# Patient Record
Sex: Female | Born: 1945
Health system: Southern US, Community
[De-identification: ages and names within clinical notes are randomized; demographics above are authoritative.]

## PROBLEM LIST (undated history)

## (undated) DIAGNOSIS — R0981 Nasal congestion: Secondary | ICD-10-CM

## (undated) DIAGNOSIS — Z973 Presence of spectacles and contact lenses: Secondary | ICD-10-CM

## (undated) DIAGNOSIS — Z8489 Family history of other specified conditions: Secondary | ICD-10-CM

## (undated) DIAGNOSIS — R7303 Prediabetes: Secondary | ICD-10-CM

## (undated) DIAGNOSIS — M1990 Unspecified osteoarthritis, unspecified site: Secondary | ICD-10-CM

## (undated) DIAGNOSIS — K219 Gastro-esophageal reflux disease without esophagitis: Secondary | ICD-10-CM

## (undated) DIAGNOSIS — N840 Polyp of corpus uteri: Secondary | ICD-10-CM

## (undated) DIAGNOSIS — J309 Allergic rhinitis, unspecified: Secondary | ICD-10-CM

## (undated) DIAGNOSIS — C801 Malignant (primary) neoplasm, unspecified: Secondary | ICD-10-CM

## (undated) DIAGNOSIS — F32A Depression, unspecified: Secondary | ICD-10-CM

## (undated) HISTORY — PX: COMBINED HYSTEROSCOPY DIAGNOSTIC / D&C: SUR297

## (undated) HISTORY — PX: MOHS SURGERY: SUR867

## (undated) HISTORY — DX: Polyp of corpus uteri: N84.0

## (undated) HISTORY — PX: CHOLECYSTECTOMY: SHX55

## (undated) HISTORY — PX: TUBAL LIGATION: SHX77

## (undated) HISTORY — DX: Unspecified osteoarthritis, unspecified site: M19.90

---

## 1965-03-26 HISTORY — PX: APPENDECTOMY: SHX54

## 1998-03-26 HISTORY — PX: FOOT SURGERY: SHX648

## 1998-05-18 ENCOUNTER — Other Ambulatory Visit: Admission: RE | Admit: 1998-05-18 | Discharge: 1998-05-18 | Payer: Self-pay | Admitting: Obstetrics and Gynecology

## 1998-05-19 ENCOUNTER — Other Ambulatory Visit: Admission: RE | Admit: 1998-05-19 | Discharge: 1998-05-19 | Payer: Self-pay | Admitting: Obstetrics and Gynecology

## 1999-05-31 ENCOUNTER — Other Ambulatory Visit: Admission: RE | Admit: 1999-05-31 | Discharge: 1999-05-31 | Payer: Self-pay | Admitting: Obstetrics and Gynecology

## 1999-11-16 ENCOUNTER — Encounter (INDEPENDENT_AMBULATORY_CARE_PROVIDER_SITE_OTHER): Payer: Self-pay

## 1999-11-16 ENCOUNTER — Other Ambulatory Visit: Admission: RE | Admit: 1999-11-16 | Discharge: 1999-11-16 | Payer: Self-pay | Admitting: Obstetrics and Gynecology

## 2000-06-06 ENCOUNTER — Other Ambulatory Visit: Admission: RE | Admit: 2000-06-06 | Discharge: 2000-06-06 | Payer: Self-pay | Admitting: Obstetrics and Gynecology

## 2001-06-09 ENCOUNTER — Other Ambulatory Visit: Admission: RE | Admit: 2001-06-09 | Discharge: 2001-06-09 | Payer: Self-pay | Admitting: Obstetrics and Gynecology

## 2002-07-09 ENCOUNTER — Other Ambulatory Visit: Admission: RE | Admit: 2002-07-09 | Discharge: 2002-07-09 | Payer: Self-pay | Admitting: Obstetrics and Gynecology

## 2003-09-14 ENCOUNTER — Other Ambulatory Visit: Admission: RE | Admit: 2003-09-14 | Discharge: 2003-09-14 | Payer: Self-pay | Admitting: Obstetrics and Gynecology

## 2004-08-24 ENCOUNTER — Ambulatory Visit (HOSPITAL_COMMUNITY): Admission: RE | Admit: 2004-08-24 | Discharge: 2004-08-24 | Payer: Self-pay | Admitting: Obstetrics and Gynecology

## 2004-08-24 ENCOUNTER — Ambulatory Visit (HOSPITAL_BASED_OUTPATIENT_CLINIC_OR_DEPARTMENT_OTHER): Admission: RE | Admit: 2004-08-24 | Discharge: 2004-08-24 | Payer: Self-pay | Admitting: Obstetrics and Gynecology

## 2004-08-24 ENCOUNTER — Encounter (INDEPENDENT_AMBULATORY_CARE_PROVIDER_SITE_OTHER): Payer: Self-pay | Admitting: *Deleted

## 2004-09-15 ENCOUNTER — Other Ambulatory Visit: Admission: RE | Admit: 2004-09-15 | Discharge: 2004-09-15 | Payer: Self-pay | Admitting: Obstetrics and Gynecology

## 2005-09-19 ENCOUNTER — Other Ambulatory Visit: Admission: RE | Admit: 2005-09-19 | Discharge: 2005-09-19 | Payer: Self-pay | Admitting: Obstetrics and Gynecology

## 2006-10-14 ENCOUNTER — Other Ambulatory Visit: Admission: RE | Admit: 2006-10-14 | Discharge: 2006-10-14 | Payer: Self-pay | Admitting: Obstetrics and Gynecology

## 2007-10-20 ENCOUNTER — Other Ambulatory Visit: Admission: RE | Admit: 2007-10-20 | Discharge: 2007-10-20 | Payer: Self-pay | Admitting: Obstetrics and Gynecology

## 2008-03-26 HISTORY — PX: ROTATOR CUFF REPAIR: SHX139

## 2008-11-01 ENCOUNTER — Other Ambulatory Visit: Admission: RE | Admit: 2008-11-01 | Discharge: 2008-11-01 | Payer: Self-pay | Admitting: Obstetrics and Gynecology

## 2008-11-01 ENCOUNTER — Encounter: Payer: Self-pay | Admitting: Obstetrics and Gynecology

## 2008-11-01 ENCOUNTER — Ambulatory Visit: Payer: Self-pay | Admitting: Obstetrics and Gynecology

## 2008-11-11 ENCOUNTER — Ambulatory Visit: Payer: Self-pay | Admitting: Obstetrics and Gynecology

## 2009-02-09 ENCOUNTER — Ambulatory Visit: Payer: Self-pay | Admitting: Obstetrics and Gynecology

## 2009-06-29 ENCOUNTER — Ambulatory Visit: Payer: Self-pay | Admitting: Obstetrics and Gynecology

## 2009-07-02 ENCOUNTER — Ambulatory Visit: Payer: Self-pay | Admitting: Obstetrics and Gynecology

## 2009-12-21 ENCOUNTER — Other Ambulatory Visit: Admission: RE | Admit: 2009-12-21 | Discharge: 2009-12-21 | Payer: Self-pay | Admitting: Obstetrics and Gynecology

## 2009-12-21 ENCOUNTER — Ambulatory Visit: Payer: Self-pay | Admitting: Obstetrics and Gynecology

## 2010-01-17 ENCOUNTER — Ambulatory Visit: Payer: Self-pay | Admitting: Sports Medicine

## 2010-01-17 DIAGNOSIS — M25559 Pain in unspecified hip: Secondary | ICD-10-CM

## 2010-01-17 DIAGNOSIS — M5430 Sciatica, unspecified side: Secondary | ICD-10-CM

## 2010-02-07 ENCOUNTER — Ambulatory Visit: Payer: Self-pay | Admitting: Obstetrics and Gynecology

## 2010-04-27 NOTE — Assessment & Plan Note (Signed)
Summary: NP WITH R HIP/BUTTOCK PAIN X ONE MO   Vital Signs:  Patient profile:   65 year old female Height:      63 inches Weight:      128 pounds BMI:     22.76 BP sitting:   121 / 77  Vitals Entered By: Lillia Pauls CMA (January 17, 2010 10:24 AM)  History of Present Illness: 65 yo female here for pain both at the R hip and buttock.  R hip pain:  has a dx of troch bursitis, has had this injected several years ago which helped for a month or so.  Since then she has been taking mobic on and off which helps a lot as well.  She does not know the dose.  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  Pain is not that bad and she has a hard time localizing it but it does bother her, does not wake her from sleep but she does notiec discomfort when laying on the right side.  No hx trauma, no overuse or changes in exercise intensity or quantity.    R buttock pain:  Notes that symptoms sometimes go into foot, worse when sitting for long periods of time, no injury, no bowel/bladder problems.  Symptoms described as tingling, occasional numbness, not really pain.  Current Medications (verified): 1)  Mobic 15 Mg Tabs (Meloxicam) .... One Tab By Mouth Daily.  Allergies (verified): No Known Drug Allergies  Review of Systems       See HPI  Physical Exam  General:  Well-developed,well-nourished,in no acute distress; alert,appropriate and cooperative throughout examination Msk:  Back normal to inspection, non tender to palpation. ROM full to flexion, ext, side bending, and rotation.  Seated and laying straight leg raise only reproduces stretch but no radicular symptoms.  Strength 5/5 to all movements of the lower ext except R hip abductors which are 4/5. DTR's 2+ to achilles and knee with downgoing plantar reflexes.    Mild TTP to R hip at greater trochanter.  Note she does have good strength of her lower ext however her R hip abductors are significantly weaker than her L  abductors.  Gait examined and unremarkable. Additional Exam:  MSK US performed: Imaged greater trochanter, bursa normal from posterior aspect and seen as just a thin stripe when viewed anteriorly.  Minimal hyperemia seen with doppler  flow.  There is evidence of hypoechoic change and calcification consistent w prior bursal inflammatio  images saved   Impression & Recommendations:  Problem # 1:  HIP PAIN, RIGHT (ICD-719.45) Assessment New  Pain suggestive of trochanteric bursitis however symptoms mostly controlled at this point. Refilled Mobic 15mg . Pt needs to work on hip abductor strengthening exercises as these were very weak on the R. Handouts given. RTC 6 weeks to see how this is doing.  Her updated medication list for this problem includes:    Mobic 15 Mg Tabs (Meloxicam) ..... One tab by mouth daily.  Orders: [place] LIMITED ([id number])  Problem # 2:  SCIATICA, RIGHT (ICD-724.3) Assessment: New  Piriformis syndrome likely on this side. Suggested piriformis type stretching as well as rehab exercises. Handouts given. RTC 6 weeks to recheck.  Her updated medication list for this problem includes:    Mobic 15 Mg Tabs (Meloxicam) ..... One tab by mouth daily.  Orders: [place] LIMITED ([id number])  Complete Medication List: 1)  Mobic 15 Mg Tabs (Meloxicam) .... One tab by mouth daily. Prescriptions: MOBIC  15 MG TABS (MELOXICAM) One tab by mouth daily.  #60 x 2   Entered and Authorized by:   Rodney Langton MD   Signed by:   Rodney Langton MD on 01/17/2010   Method used:   Print then Give to Patient   RxID:   [id number]    Orders Added: 1)  New Patient Level III [56213] 2)  [place] LIMITED [[id number]]

## 2010-06-06 ENCOUNTER — Encounter: Payer: Self-pay | Admitting: *Deleted

## 2010-08-11 NOTE — Op Note (Signed)
Kimberly Leon, Kimberly Leon            ACCOUNT NO.:  [id number]   MEDICAL RECORD NO.:  [id number]          PATIENT TYPE:  AMB   LOCATION:  [facility]                         FACILITY:  [facility]   PHYSICIAN:  Daniel L. Gottsegen, M.D.DATE OF BIRTH:  1946/03/17   DATE OF PROCEDURE:  08/24/2004  DATE OF DISCHARGE:                                 OPERATIVE REPORT   PREOPERATIVE DIAGNOSIS:  Postmenopausal bleeding with endometrial polyp.   POSTOPERATIVE DIAGNOSIS:  Postmenopausal bleeding with endometrial polyp.   OPERATIONS:  Hysteroscopy D&C.   SURGEON:  Dr. Eda Paschal.   ANESTHESIA:  General.   INDICATIONS:  The patient is a 65 year old postmenopausal female, who had an  episode of postmenopausal bleeding.  Endometrial biopsy was consistent with  endometrial polyp.  The bleeding persisted.  It was felt that she probably  still had polypoid tissue in her uterus, and she enters the hospital now for  hysteroscopy D&C.   FINDINGS:  External exam is normal.  BUS is normal.  Vagina is normal.  Cervix is clean.  Uterus is retroverted, normal size and shape with first-  degree uterine descensus.  Adnexa are palpably normal.  At the time of  hysteroscopy, the patient had an endometrial polyp about 1.5 cm coming off  her left lateral wall approximately halfway up the fundus.  Other than this,  top of the fundus, tubal ostia, anterior posterior walls of the fundus,  lower uterine segment, and endocervical canal were all free of disease.   PROCEDURE:  After adequate general orotracheal anesthesia, the patient was  placed in a dorsolithotomy position, prepped and draped in usual sterile  manner.  A single-tooth tenaculum was placed in the anterior lip of the  cervix.  The cervix was dilated to a #33 Pratt dilator, and then a  hysteroscopic examination was done with the hysteroscopic resectoscope.  Sorbitol  3% was used to expand the intrauterine cavity, and a camera was  used for magnification.  A  90-degree wire loop with settings of 70 coag and  110 cutting blend 1 was utilized.  The polypoid described above was  visualized and could be completely excised with the resectoscope.  Attention  now showed a completely normal endometrial cavity without pathology.  There  were several small fragments of endometrium which were also removed and also  sent to pathology along with the polyp.  At the termination of the  procedure, there was no bleeding noted.  Blood loss was minimal.  Fluid  deficit was less than 100 mL.  The patient tolerated the procedure well and  left the operating room in satisfactory condition.      DLG/MEDQ  D:  08/24/2004  T:  08/25/2004  Job:  [id number]

## 2010-11-13 ENCOUNTER — Other Ambulatory Visit: Payer: Self-pay | Admitting: Obstetrics and Gynecology

## 2010-11-13 NOTE — Telephone Encounter (Signed)
Patient had also called about this rx.  Called patient and told her rx escribed.

## 2010-12-26 ENCOUNTER — Ambulatory Visit (INDEPENDENT_AMBULATORY_CARE_PROVIDER_SITE_OTHER): Payer: BC Managed Care – PPO | Admitting: Obstetrics and Gynecology

## 2010-12-26 ENCOUNTER — Encounter: Payer: Self-pay | Admitting: Obstetrics and Gynecology

## 2010-12-26 ENCOUNTER — Other Ambulatory Visit (HOSPITAL_COMMUNITY)
Admission: RE | Admit: 2010-12-26 | Discharge: 2010-12-26 | Disposition: A | Discharge status: Home / Self Care | Payer: BC Managed Care – PPO | Source: Ambulatory Visit | Attending: Obstetrics and Gynecology | Admitting: Obstetrics and Gynecology

## 2010-12-26 VITALS — BP 120/64 | Ht 62.0 in | Wt 130.0 lb

## 2010-12-26 DIAGNOSIS — Z01419 Encounter for gynecological examination (general) (routine) without abnormal findings: Secondary | ICD-10-CM | POA: Insufficient documentation

## 2010-12-26 DIAGNOSIS — Z780 Asymptomatic menopausal state: Secondary | ICD-10-CM

## 2010-12-26 DIAGNOSIS — N951 Menopausal and female climacteric states: Secondary | ICD-10-CM

## 2010-12-26 MED ORDER — ESTRADIOL-LEVONORGESTREL 0.045-0.015 MG/DAY TD PTWK: 1.0000 | MEDICATED_PATCH | TRANSDERMAL | Status: DC

## 2010-12-26 NOTE — Progress Notes (Signed)
Patient came to see me today for her annual GYN exam. She had her Mammogram today. She is up-to-date on bone densities. She is doing well on her patch without any vaginal bleeding. She is unfortunately getting divorced. Her husband is not unfaithful.  Physical examination: HEENT within normal limits. Neck: Thyroid not large. No masses. Supraclavicular nodes: not enlarged. Breasts: Examined in both sitting midline position. No skin changes and no masses. Abdomen: Soft no guarding rebound or masses or hernia. Pelvic: External: Within normal limits. BUS: Within normal limits. Vaginal:within normal limits. Good estrogen effect. No evidence of cystocele rectocele or enterocele. Cervix: clean. Uterus: Normal size and shape. Adnexa: No masses. Rectovaginal exam: Confirmatory and negative. Extremities: Within normal limits.  Assessment: Menopausal symptoms  Plan: Continue Climara pro patch. Discussed increased risk of breast cancer. Patient feels benefits outweigh the risks.

## 2010-12-28 ENCOUNTER — Other Ambulatory Visit: Payer: Self-pay | Admitting: *Deleted

## 2010-12-28 DIAGNOSIS — N63 Unspecified lump in unspecified breast: Secondary | ICD-10-CM

## 2011-01-03 ENCOUNTER — Other Ambulatory Visit: Payer: Self-pay | Admitting: Obstetrics and Gynecology

## 2011-01-03 DIAGNOSIS — N63 Unspecified lump in unspecified breast: Secondary | ICD-10-CM

## 2011-04-16 ENCOUNTER — Telehealth: Payer: Self-pay | Admitting: *Deleted

## 2011-04-16 DIAGNOSIS — Z780 Asymptomatic menopausal state: Secondary | ICD-10-CM

## 2011-04-16 MED ORDER — ESTRADIOL-LEVONORGESTREL 0.045-0.015 MG/DAY TD PTWK: 1.0000 | MEDICATED_PATCH | TRANSDERMAL | Status: DC

## 2011-04-16 NOTE — Telephone Encounter (Signed)
Pt called to have climara pro patch switched to [facility] with 90 day supply. rx sent to pharmacy, per pt request.

## 2011-09-26 ENCOUNTER — Encounter: Payer: Self-pay | Admitting: Rheumatology

## 2012-01-01 ENCOUNTER — Encounter: Payer: Self-pay | Admitting: Obstetrics and Gynecology

## 2012-01-15 ENCOUNTER — Encounter: Payer: BC Managed Care – PPO | Admitting: Obstetrics and Gynecology

## 2012-02-07 ENCOUNTER — Ambulatory Visit (INDEPENDENT_AMBULATORY_CARE_PROVIDER_SITE_OTHER): Payer: Medicare Other | Admitting: Obstetrics and Gynecology

## 2012-02-07 ENCOUNTER — Encounter: Payer: Self-pay | Admitting: Obstetrics and Gynecology

## 2012-02-07 VITALS — BP 130/80 | Ht 62.0 in | Wt 132.0 lb

## 2012-02-07 DIAGNOSIS — Z780 Asymptomatic menopausal state: Secondary | ICD-10-CM

## 2012-02-07 MED ORDER — ESTRADIOL-LEVONORGESTREL 0.045-0.015 MG/DAY TD PTWK: 1.0000 | MEDICATED_PATCH | TRANSDERMAL | Status: DC

## 2012-02-07 NOTE — Progress Notes (Signed)
Patient came to see me today for her annual GYN exam. She remains on hormone replacement therapy for control of hot flashes and vaginal dryness. She previously had some postmenopausal bleeding. She had an endometrial polyp. She now remains amenorrheic. She has divorced and has a new partner for greater than one year. She is having no problems with intercourse. She is having no vaginal bleeding. She is having no pelvic pain. Her last bone density was November 2010. It was normal. She has always had normal Pap smears. Her last Pap smear was 2012.  ROS: 12 system review done. Pertinent positives above. Other positive Is  hip pain with sciatica.  Physical examination:Kimberly Leon present. HEENT within normal limits. Neck: Thyroid not large. No masses. Supraclavicular nodes: not enlarged. Breasts: Examined in both sitting and lying  position. No skin changes and no masses. Abdomen: Soft no guarding rebound or masses or hernia. Pelvic: External: Within normal limits. BUS: Within normal limits. Vaginal:within normal limits. Good estrogen effect. No evidence of cystocele rectocele or enterocele. Cervix: clean. Uterus: Normal size and shape. Adnexa: No masses. Rectovaginal exam: Confirmatory and negative. Extremities: Within normal limits.  Assessment: #1.Hot  Flashes #2. Atrophic vaginitis #3. Postmenopausal bleeding with endometrial polyp.  Plan: Continue yearly mammograms. Discussed increased risk of breast cancer with her HRT. Continue estrogen patch. Discussed new estrogen-serm. Pap not done.The new Pap smear guidelines were discussed with the patient. Although new guidelines do not take into  account sexual history I suggested followup Pap at some point due to change in partner.

## 2012-02-07 NOTE — Patient Instructions (Addendum)
Continue yearly mammograms 

## 2012-02-14 ENCOUNTER — Ambulatory Visit: Payer: Self-pay | Admitting: Podiatry

## 2012-03-26 HISTORY — PX: COLONOSCOPY: SHX174

## 2013-01-07 ENCOUNTER — Encounter: Payer: Self-pay | Admitting: Gynecology

## 2013-01-12 ENCOUNTER — Other Ambulatory Visit: Payer: Self-pay | Admitting: *Deleted

## 2013-01-12 DIAGNOSIS — R928 Other abnormal and inconclusive findings on diagnostic imaging of breast: Secondary | ICD-10-CM

## 2013-01-15 ENCOUNTER — Other Ambulatory Visit: Payer: Self-pay | Admitting: *Deleted

## 2013-01-15 DIAGNOSIS — R928 Other abnormal and inconclusive findings on diagnostic imaging of breast: Secondary | ICD-10-CM

## 2013-04-20 ENCOUNTER — Telehealth: Payer: Self-pay | Admitting: *Deleted

## 2013-04-20 DIAGNOSIS — Z780 Asymptomatic menopausal state: Secondary | ICD-10-CM

## 2013-04-20 MED ORDER — ESTRADIOL-LEVONORGESTREL 0.045-0.015 MG/DAY TD PTWK: 1.0000 | MEDICATED_PATCH | TRANSDERMAL | Status: DC

## 2013-04-20 NOTE — Telephone Encounter (Signed)
Pt has annual scheduled on 05/18/13 requesting #4 combipatch sent to pharmacy

## 2013-05-18 ENCOUNTER — Ambulatory Visit (INDEPENDENT_AMBULATORY_CARE_PROVIDER_SITE_OTHER): Payer: Medicare HMO | Admitting: Gynecology

## 2013-05-18 ENCOUNTER — Encounter: Payer: Self-pay | Admitting: Gynecology

## 2013-05-18 ENCOUNTER — Other Ambulatory Visit (HOSPITAL_COMMUNITY)
Admission: RE | Admit: 2013-05-18 | Discharge: 2013-05-18 | Disposition: A | Discharge status: Home / Self Care | Payer: Medicare PPO | Source: Ambulatory Visit | Attending: Gynecology | Admitting: Gynecology

## 2013-05-18 VITALS — BP 120/76 | Ht 62.0 in | Wt 135.0 lb

## 2013-05-18 DIAGNOSIS — Z780 Asymptomatic menopausal state: Secondary | ICD-10-CM

## 2013-05-18 DIAGNOSIS — Z124 Encounter for screening for malignant neoplasm of cervix: Secondary | ICD-10-CM | POA: Insufficient documentation

## 2013-05-18 DIAGNOSIS — N951 Menopausal and female climacteric states: Secondary | ICD-10-CM

## 2013-05-18 DIAGNOSIS — Z79890 Hormone replacement therapy (postmenopausal): Secondary | ICD-10-CM

## 2013-05-18 DIAGNOSIS — N952 Postmenopausal atrophic vaginitis: Secondary | ICD-10-CM

## 2013-05-18 MED ORDER — ESTRADIOL-LEVONORGESTREL 0.045-0.015 MG/DAY TD PTWK: 1.0000 | MEDICATED_PATCH | TRANSDERMAL | Status: DC

## 2013-05-18 NOTE — Progress Notes (Signed)
Kimberly Leon 1946-02-25 [id number]        68 y.o.  [id number] for followup exam.  Former patient of Dr. Cherylann Banas. Several issues noted below.  Past medical history,surgical history, problem list, medications, allergies, family history and social history were all reviewed and documented in the EPIC chart.  ROS:  Performed and pertinent positives and negatives are included in the history, assessment and plan .  Exam: [name] assistant Filed Vitals:   05/18/13 1523  BP: 120/76  Height: 5\' 2"  (1.575 m)  Weight: 135 lb (61.236 kg)   General appearance  Normal Skin grossly normal Head/Neck normal with no cervical or supraclavicular adenopathy thyroid normal Lungs  clear Cardiac RR, without RMG Abdominal  soft, nontender, without masses, organomegaly or hernia Breasts  examined lying and sitting without masses, retractions, discharge or axillary adenopathy. Pelvic  Ext/BUS/vagina with generalized atrophic changes  Cervix with atrophic changes. Pap done  Uterus anteverted, normal size, shape and contour, midline and mobile nontender   Adnexa  Without masses or tenderness    Anus and perineum  Normal   Rectovaginal  Normal sphincter tone without palpated masses or tenderness.    Assessment/Plan:  68 y.o. [id number] female for annual exam.   1. Postmenopausal/menopausal symptoms. Patient on Climara pro 0.045/0.015 doing well started for menopausal symptoms. Has tried weaning this past year with unacceptable hot flushes and night sweats.  I reviewed the whole issue of HRT with her to include the WHI study with increased risk of stroke, heart attack, DVT and breast cancer. The ACOG and NAMS statements for lowest dose for the shortest period of time reviewed. Transdermal versus oral first-pass effect benefit discussed. Patient wants to continue it I refilled her x1 year. We did discuss alternatives to include [facility] but she would prefer to stay with the patch. No vaginal bleeding at all and she knows  to report any vaginal bleeding. 2. Pap smear 2012. Pap done today. Review current screening guidelines. Options to stop screening altogether if she is over the age of 26 and no history of significant abnormal Pap smears discussed. We'll readdress on annual basis. 3. Mammography 12/2012. Continue with annual mammography. 4. DEXA 01/2009 normal. Repeat next year at 5 year interval. Increase calcium and vitamin D recommendations reviewed. 5. Colonoscopy 2014. Repeat at their recommended interval. 6. Health maintenance. No blood work done and she reports is done through her primary physician's office. Followup one year, sooner as needed.   Note: This document was prepared with digital dictation and possible smart phrase technology. Any transcriptional errors that result from this process are unintentional.   Anastasio Auerbach MD, 4:11 PM 05/18/2013

## 2013-05-18 NOTE — Patient Instructions (Signed)
Continue on the hormone patches at your choice. Call me if any issues. Follow up in one year for annual exam.

## 2013-05-19 ENCOUNTER — Other Ambulatory Visit: Payer: Self-pay | Admitting: Gynecology

## 2013-05-19 DIAGNOSIS — R822 Biliuria: Secondary | ICD-10-CM

## 2013-05-19 LAB — URINALYSIS W MICROSCOPIC + REFLEX CULTURE
BACTERIA UA: NONE SEEN
CASTS: NONE SEEN
CRYSTALS: NONE SEEN
Glucose, UA: NEGATIVE mg/dL
Hgb urine dipstick: NEGATIVE
KETONES UR: NEGATIVE mg/dL
Leukocytes, UA: NEGATIVE
Nitrite: NEGATIVE
PH: 6 (ref 5.0–8.0)
Protein, ur: NEGATIVE mg/dL
SPECIFIC GRAVITY, URINE: 1.008 (ref 1.005–1.030)
Squamous Epithelial / LPF: NONE SEEN
Urobilinogen, UA: 0.2 mg/dL (ref 0.0–1.0)

## 2013-05-20 ENCOUNTER — Other Ambulatory Visit: Payer: Medicare HMO

## 2013-05-20 DIAGNOSIS — R822 Biliuria: Secondary | ICD-10-CM

## 2013-05-20 LAB — COMPREHENSIVE METABOLIC PANEL
ALK PHOS: 65 U/L (ref 39–117)
ALT: 18 U/L (ref 0–35)
AST: 19 U/L (ref 0–37)
Albumin: 4.2 g/dL (ref 3.5–5.2)
BILIRUBIN TOTAL: 0.5 mg/dL (ref 0.2–1.2)
BUN: 17 mg/dL (ref 6–23)
CO2: 26 mEq/L (ref 19–32)
CREATININE: 0.7 mg/dL (ref 0.50–1.10)
Calcium: 9.2 mg/dL (ref 8.4–10.5)
Chloride: 102 mEq/L (ref 96–112)
Glucose, Bld: 103 mg/dL — ABNORMAL HIGH (ref 70–99)
Potassium: 3.7 mEq/L (ref 3.5–5.3)
Sodium: 137 mEq/L (ref 135–145)
TOTAL PROTEIN: 6.6 g/dL (ref 6.0–8.3)

## 2013-08-11 ENCOUNTER — Telehealth: Payer: Self-pay | Admitting: *Deleted

## 2013-08-11 DIAGNOSIS — Z780 Asymptomatic menopausal state: Secondary | ICD-10-CM

## 2013-08-11 MED ORDER — ESTRADIOL-LEVONORGESTREL 0.045-0.015 MG/DAY TD PTWK
1.0000 | MEDICATED_PATCH | TRANSDERMAL | Status: DC
Start: 2013-08-11 — End: 2013-11-03

## 2013-08-11 NOTE — Telephone Encounter (Signed)
Pt called requesting 3 month supply for Climara pro 0.045/0.015, rx will be called in. [facility] does not have e-scribe. Spoke with Cyril Mourning called rx into pharmacy.

## 2013-08-20 ENCOUNTER — Telehealth: Payer: Self-pay | Admitting: *Deleted

## 2013-08-20 NOTE — Telephone Encounter (Signed)
Pt called stating climara pro patch is not on formulary list for covered medication. Pt will call insurance company to get a list of covered meds and call back.

## 2013-11-03 ENCOUNTER — Other Ambulatory Visit: Payer: Self-pay | Admitting: Gynecology

## 2014-01-25 ENCOUNTER — Encounter: Payer: Self-pay | Admitting: Gynecology

## 2014-03-26 HISTORY — PX: EXCISION MORTON'S NEUROMA: SHX5013

## 2014-06-28 ENCOUNTER — Encounter: Payer: Self-pay | Admitting: Gynecology

## 2014-06-28 ENCOUNTER — Ambulatory Visit (INDEPENDENT_AMBULATORY_CARE_PROVIDER_SITE_OTHER): Payer: PPO | Admitting: Gynecology

## 2014-06-28 VITALS — BP 124/70 | Ht 62.0 in | Wt 140.0 lb

## 2014-06-28 DIAGNOSIS — N952 Postmenopausal atrophic vaginitis: Secondary | ICD-10-CM

## 2014-06-28 DIAGNOSIS — Z79890 Hormone replacement therapy (postmenopausal): Secondary | ICD-10-CM | POA: Diagnosis not present

## 2014-06-28 DIAGNOSIS — Z01419 Encounter for gynecological examination (general) (routine) without abnormal findings: Secondary | ICD-10-CM | POA: Diagnosis not present

## 2014-06-28 MED ORDER — ESTRADIOL-LEVONORGESTREL 0.045-0.015 MG/DAY TD PTWK: MEDICATED_PATCH | TRANSDERMAL | Status: DC

## 2014-06-28 NOTE — Patient Instructions (Signed)
You may obtain a copy of any labs that were done today by logging onto MyChart as outlined in the instructions provided with your AVS (after visit summary). The office will not call with normal lab results but certainly if there are any significant abnormalities then we will contact you.   Health Maintenance, Female A healthy lifestyle and preventative care can promote health and wellness.  Maintain regular health, dental, and eye exams.  Eat a healthy diet. Foods like vegetables, fruits, whole grains, low-fat dairy products, and lean protein foods contain the nutrients you need without too many calories. Decrease your intake of foods high in solid fats, added sugars, and salt. Get information about a proper diet from your caregiver, if necessary.  Regular physical exercise is one of the most important things you can do for your health. Most adults should get at least 150 minutes of moderate-intensity exercise (any activity that increases your heart rate and causes you to sweat) each week. In addition, most adults need muscle-strengthening exercises on 2 or more days a week.   Maintain a healthy weight. The body mass index (BMI) is a screening tool to identify possible weight problems. It provides an estimate of body fat based on height and weight. Your caregiver can help determine your BMI, and can help you achieve or maintain a healthy weight. For adults 20 years and older:  A BMI below 18.5 is considered underweight.  A BMI of 18.5 to 24.9 is normal.  A BMI of 25 to 29.9 is considered overweight.  A BMI of 30 and above is considered obese.  Maintain normal blood lipids and cholesterol by exercising and minimizing your intake of saturated fat. Eat a balanced diet with plenty of fruits and vegetables. Blood tests for lipids and cholesterol should begin at age 61 and be repeated every 5 years. If your lipid or cholesterol levels are high, you are over 50, or you are a high risk for heart  disease, you may need your cholesterol levels checked more frequently.Ongoing high lipid and cholesterol levels should be treated with medicines if diet and exercise are not effective.  If you smoke, find out from your caregiver how to quit. If you do not use tobacco, do not start.  Lung cancer screening is recommended for adults aged 33 80 years who are at high risk for developing lung cancer because of a history of smoking. Yearly low-dose computed tomography (CT) is recommended for people who have at least a 30-pack-year history of smoking and are a current smoker or have quit within the past 15 years. A pack year of smoking is smoking an average of 1 pack of cigarettes a day for 1 year (for example: 1 pack a day for 30 years or 2 packs a day for 15 years). Yearly screening should continue until the smoker has stopped smoking for at least 15 years. Yearly screening should also be stopped for people who develop a health problem that would prevent them from having lung cancer treatment.  If you are pregnant, do not drink alcohol. If you are breastfeeding, be very cautious about drinking alcohol. If you are not pregnant and choose to drink alcohol, do not exceed 1 drink per day. One drink is considered to be 12 ounces (355 mL) of beer, 5 ounces (148 mL) of wine, or 1.5 ounces (44 mL) of liquor.  Avoid use of street drugs. Do not share needles with anyone. Ask for help if you need support or instructions about stopping  the use of drugs.  High blood pressure causes heart disease and increases the risk of stroke. Blood pressure should be checked at least every 1 to 2 years. Ongoing high blood pressure should be treated with medicines, if weight loss and exercise are not effective.  If you are 59 to 69 years old, ask your caregiver if you should take aspirin to prevent strokes.  Diabetes screening involves taking a blood sample to check your fasting blood sugar level. This should be done once every 3  years, after age 91, if you are within normal weight and without risk factors for diabetes. Testing should be considered at a younger age or be carried out more frequently if you are overweight and have at least 1 risk factor for diabetes.  Breast cancer screening is essential preventative care for women. You should practice "breast self-awareness." This means understanding the normal appearance and feel of your breasts and may include breast self-examination. Any changes detected, no matter how small, should be reported to a caregiver. Women in their 66s and 30s should have a clinical breast exam (CBE) by a caregiver as part of a regular health exam every 1 to 3 years. After age 101, women should have a CBE every year. Starting at age 100, women should consider having a mammogram (breast X-ray) every year. Women who have a family history of breast cancer should talk to their caregiver about genetic screening. Women at a high risk of breast cancer should talk to their caregiver about having an MRI and a mammogram every year.  Breast cancer gene (BRCA)-related cancer risk assessment is recommended for women who have family members with BRCA-related cancers. BRCA-related cancers include breast, ovarian, tubal, and peritoneal cancers. Having family members with these cancers may be associated with an increased risk for harmful changes (mutations) in the breast cancer genes BRCA1 and BRCA2. Results of the assessment will determine the need for genetic counseling and BRCA1 and BRCA2 testing.  The Pap test is a screening test for cervical cancer. Women should have a Pap test starting at age 57. Between ages 25 and 35, Pap tests should be repeated every 2 years. Beginning at age 37, you should have a Pap test every 3 years as long as the past 3 Pap tests have been normal. If you had a hysterectomy for a problem that was not cancer or a condition that could lead to cancer, then you no longer need Pap tests. If you are  between ages 50 and 76, and you have had normal Pap tests going back 10 years, you no longer need Pap tests. If you have had past treatment for cervical cancer or a condition that could lead to cancer, you need Pap tests and screening for cancer for at least 20 years after your treatment. If Pap tests have been discontinued, risk factors (such as a new sexual partner) need to be reassessed to determine if screening should be resumed. Some women have medical problems that increase the chance of getting cervical cancer. In these cases, your caregiver may recommend more frequent screening and Pap tests.  The human papillomavirus (HPV) test is an additional test that may be used for cervical cancer screening. The HPV test looks for the virus that can cause the cell changes on the cervix. The cells collected during the Pap test can be tested for HPV. The HPV test could be used to screen women aged 44 years and older, and should be used in women of any age  who have unclear Pap test results. After the age of 55, women should have HPV testing at the same frequency as a Pap test.  Colorectal cancer can be detected and often prevented. Most routine colorectal cancer screening begins at the age of 44 and continues through age 20. However, your caregiver may recommend screening at an earlier age if you have risk factors for colon cancer. On a yearly basis, your caregiver may provide home test kits to check for hidden blood in the stool. Use of a small camera at the end of a tube, to directly examine the colon (sigmoidoscopy or colonoscopy), can detect the earliest forms of colorectal cancer. Talk to your caregiver about this at age 86, when routine screening begins. Direct examination of the colon should be repeated every 5 to 10 years through age 13, unless early forms of pre-cancerous polyps or small growths are found.  Hepatitis C blood testing is recommended for all people born from [birth year] through 1965 and any  individual with known risks for hepatitis C.  Practice safe sex. Use condoms and avoid high-risk sexual practices to reduce the spread of sexually transmitted infections (STIs). Sexually active women aged 36 and younger should be checked for Chlamydia, which is a common sexually transmitted infection. Older women with new or multiple partners should also be tested for Chlamydia. Testing for other STIs is recommended if you are sexually active and at increased risk.  Osteoporosis is a disease in which the bones lose minerals and strength with aging. This can result in serious bone fractures. The risk of osteoporosis can be identified using a bone density scan. Women ages 20 and over and women at risk for fractures or osteoporosis should discuss screening with their caregivers. Ask your caregiver whether you should be taking a calcium supplement or vitamin D to reduce the rate of osteoporosis.  Menopause can be associated with physical symptoms and risks. Hormone replacement therapy is available to decrease symptoms and risks. You should talk to your caregiver about whether hormone replacement therapy is right for you.  Use sunscreen. Apply sunscreen liberally and repeatedly throughout the day. You should seek shade when your shadow is shorter than you. Protect yourself by wearing long sleeves, pants, a wide-brimmed hat, and sunglasses year round, whenever you are outdoors.  Notify your caregiver of new moles or changes in moles, especially if there is a change in shape or color. Also notify your caregiver if a mole is larger than the size of a pencil eraser.  Stay current with your immunizations. Document Released: 09/25/2010 Document Revised: 07/07/2012 Document Reviewed: 09/25/2010 [facility] Patient Information 2014 [employer].

## 2014-06-28 NOTE — Progress Notes (Signed)
Kimberly Leon 1945/07/25 [id number]        68 y.o.  G2P2002 for breast and pelvic exam. Several issues noted below.  Past medical history,surgical history, problem list, medications, allergies, family history and social history were all reviewed and documented as reviewed in the EPIC chart.  ROS:  Performed with pertinent positives and negatives included in the history, assessment and plan.   Additional significant findings :  none   Exam: Kimberly Leon Vitals:   06/28/14 1359  BP: 124/70  Height: 5\' 2"  (1.575 m)  Weight: 140 lb (63.504 kg)   General appearance:  Normal affect, orientation and appearance. Skin: Grossly normal HEENT: Without gross lesions.  No cervical or supraclavicular adenopathy. Thyroid normal.  Lungs:  Clear without wheezing, rales or rhonchi Cardiac: RR, without RMG Abdominal:  Soft, nontender, without masses, guarding, rebound, organomegaly or hernia Breasts:  Examined lying and sitting without masses, retractions, discharge or axillary adenopathy. Pelvic:  Ext/BUS/vagina with generalized atrophic changes  Cervix with atrophic changes  Uterus anteverted, normal size, shape and contour, midline and mobile nontender   Adnexa  Without masses or tenderness    Anus and perineum  Normal   Rectovaginal  Normal sphincter tone without palpated masses or tenderness.    Assessment/Plan:  69 y.o. [id number] female for breast and pelvic exam.   1. Postmenopausal/HRT.  Patient continues on Climara Pro 0.045/0.015. Tried weaning but had unacceptable hot flashes. I again reviewed the issues of HRT to include increased risk of stroke heart attack DVT and the WHI study. ACOG and NAMS statements for lowest dose for shortest period of time. Went to wean with aging. After a lengthy discussion given that she still is having significant symptoms when she does try to wean we both agreed to continue for now. Refill 1 year provided. No history of bleeding. Patient knows to  report any vaginal bleeding. 2. Mammography 12/2013. Continue with annual mammography. SBE monthly reviewed. 3. Pap smear 04/2013. No Pap smear done today.  No history of abnormal Pap smears. Options to stop screening altogether she is over the age of 28 versus less frequent screening intervals reviewed. Will readdress on an annual basis. 4. DEXA 2010 normal. As she remains on HRT will plan on repeat DEXA at age 67. Increased calcium vitamin D reviewed. 5. Colonoscopy 2014. Repeat at their recommended interval. 6. Health maintenance. No routine blood work done as patient reports this done at her primary physician's office. Follow up in one year, sooner as needed.     Anastasio Auerbach MD, 2:50 PM 06/28/2014

## 2014-06-29 LAB — URINALYSIS W MICROSCOPIC + REFLEX CULTURE
BACTERIA UA: NONE SEEN
BILIRUBIN URINE: NEGATIVE
Casts: NONE SEEN
Crystals: NONE SEEN
Glucose, UA: NEGATIVE mg/dL
Hgb urine dipstick: NEGATIVE
Ketones, ur: NEGATIVE mg/dL
Leukocytes, UA: NEGATIVE
Nitrite: NEGATIVE
PROTEIN: NEGATIVE mg/dL
SQUAMOUS EPITHELIAL / LPF: NONE SEEN
Urobilinogen, UA: 0.2 mg/dL (ref 0.0–1.0)
pH: 6 (ref 5.0–8.0)

## 2014-07-13 ENCOUNTER — Telehealth: Payer: Self-pay | Admitting: *Deleted

## 2014-07-13 NOTE — Telephone Encounter (Signed)
Prior authorization for climara pro patch done online will wait for response.

## 2014-07-14 NOTE — Telephone Encounter (Signed)
Patch approved until 03/26/15

## 2014-07-19 DIAGNOSIS — K219 Gastro-esophageal reflux disease without esophagitis: Secondary | ICD-10-CM | POA: Insufficient documentation

## 2014-07-19 DIAGNOSIS — F325 Major depressive disorder, single episode, in full remission: Secondary | ICD-10-CM | POA: Insufficient documentation

## 2014-07-19 DIAGNOSIS — R7303 Prediabetes: Secondary | ICD-10-CM | POA: Insufficient documentation

## 2014-07-19 DIAGNOSIS — M159 Polyosteoarthritis, unspecified: Secondary | ICD-10-CM | POA: Insufficient documentation

## 2015-01-19 ENCOUNTER — Encounter: Payer: Self-pay | Admitting: Gynecology

## 2015-04-14 DIAGNOSIS — Z1283 Encounter for screening for malignant neoplasm of skin: Secondary | ICD-10-CM | POA: Diagnosis not present

## 2015-04-14 DIAGNOSIS — D1800 Hemangioma unspecified site: Secondary | ICD-10-CM | POA: Diagnosis not present

## 2015-04-14 DIAGNOSIS — L821 Other seborrheic keratosis: Secondary | ICD-10-CM | POA: Diagnosis not present

## 2015-04-14 DIAGNOSIS — L219 Seborrheic dermatitis, unspecified: Secondary | ICD-10-CM | POA: Diagnosis not present

## 2015-04-14 DIAGNOSIS — D229 Melanocytic nevi, unspecified: Secondary | ICD-10-CM | POA: Diagnosis not present

## 2015-04-14 DIAGNOSIS — L820 Inflamed seborrheic keratosis: Secondary | ICD-10-CM | POA: Diagnosis not present

## 2015-04-14 DIAGNOSIS — L812 Freckles: Secondary | ICD-10-CM | POA: Diagnosis not present

## 2015-04-14 DIAGNOSIS — L578 Other skin changes due to chronic exposure to nonionizing radiation: Secondary | ICD-10-CM | POA: Diagnosis not present

## 2015-07-11 DIAGNOSIS — H25813 Combined forms of age-related cataract, bilateral: Secondary | ICD-10-CM | POA: Diagnosis not present

## 2015-07-11 DIAGNOSIS — H04123 Dry eye syndrome of bilateral lacrimal glands: Secondary | ICD-10-CM | POA: Diagnosis not present

## 2015-07-11 DIAGNOSIS — H1045 Other chronic allergic conjunctivitis: Secondary | ICD-10-CM | POA: Diagnosis not present

## 2015-07-11 DIAGNOSIS — D3132 Benign neoplasm of left choroid: Secondary | ICD-10-CM | POA: Diagnosis not present

## 2015-07-25 ENCOUNTER — Other Ambulatory Visit: Payer: Self-pay | Admitting: Gynecology

## 2015-07-28 DIAGNOSIS — R7301 Impaired fasting glucose: Secondary | ICD-10-CM | POA: Diagnosis not present

## 2015-07-28 DIAGNOSIS — R739 Hyperglycemia, unspecified: Secondary | ICD-10-CM | POA: Diagnosis not present

## 2015-07-28 DIAGNOSIS — F325 Major depressive disorder, single episode, in full remission: Secondary | ICD-10-CM | POA: Diagnosis not present

## 2015-08-02 DIAGNOSIS — R7301 Impaired fasting glucose: Secondary | ICD-10-CM | POA: Diagnosis not present

## 2015-09-23 ENCOUNTER — Encounter: Payer: PPO | Admitting: Gynecology

## 2015-10-07 ENCOUNTER — Other Ambulatory Visit: Payer: Self-pay | Admitting: Gynecology

## 2015-10-07 NOTE — Telephone Encounter (Signed)
Annual on 12/12/15, Rx sent.

## 2015-12-12 ENCOUNTER — Encounter: Payer: PPO | Admitting: Gynecology

## 2015-12-14 ENCOUNTER — Other Ambulatory Visit: Payer: Self-pay | Admitting: Gynecology

## 2015-12-26 DIAGNOSIS — H2513 Age-related nuclear cataract, bilateral: Secondary | ICD-10-CM | POA: Diagnosis not present

## 2016-01-20 DIAGNOSIS — M955 Acquired deformity of pelvis: Secondary | ICD-10-CM | POA: Diagnosis not present

## 2016-01-20 DIAGNOSIS — M9903 Segmental and somatic dysfunction of lumbar region: Secondary | ICD-10-CM | POA: Diagnosis not present

## 2016-01-20 DIAGNOSIS — M9905 Segmental and somatic dysfunction of pelvic region: Secondary | ICD-10-CM | POA: Diagnosis not present

## 2016-01-20 DIAGNOSIS — M5114 Intervertebral disc disorders with radiculopathy, thoracic region: Secondary | ICD-10-CM | POA: Diagnosis not present

## 2016-01-24 ENCOUNTER — Encounter: Payer: Self-pay | Admitting: Gynecology

## 2016-01-24 ENCOUNTER — Ambulatory Visit (INDEPENDENT_AMBULATORY_CARE_PROVIDER_SITE_OTHER): Payer: PPO | Admitting: Gynecology

## 2016-01-24 VITALS — BP 124/70 | Ht 62.0 in | Wt 135.0 lb

## 2016-01-24 DIAGNOSIS — N952 Postmenopausal atrophic vaginitis: Secondary | ICD-10-CM | POA: Diagnosis not present

## 2016-01-24 DIAGNOSIS — Z79890 Hormone replacement therapy (postmenopausal): Secondary | ICD-10-CM | POA: Diagnosis not present

## 2016-01-24 DIAGNOSIS — Z1231 Encounter for screening mammogram for malignant neoplasm of breast: Secondary | ICD-10-CM | POA: Diagnosis not present

## 2016-01-24 DIAGNOSIS — Z01419 Encounter for gynecological examination (general) (routine) without abnormal findings: Secondary | ICD-10-CM | POA: Diagnosis not present

## 2016-01-24 DIAGNOSIS — Z803 Family history of malignant neoplasm of breast: Secondary | ICD-10-CM | POA: Diagnosis not present

## 2016-01-24 MED ORDER — ESTRADIOL-LEVONORGESTREL 0.045-0.015 MG/DAY TD PTWK: MEDICATED_PATCH | TRANSDERMAL | 12 refills | Status: DC

## 2016-01-24 NOTE — Patient Instructions (Signed)

## 2016-01-24 NOTE — Progress Notes (Signed)
    CAROLINE TWIDDY [birth year] [id number]        70 y.o.  [id number]  for breast and pelvic exam.  Past medical history,surgical history, problem list, medications, allergies, family history and social history were all reviewed and documented as reviewed in the EPIC chart.  ROS:  Performed with pertinent positives and negatives included in the history, assessment and plan.   Additional significant findings :  None   Exam: Caryn Bee assistant Vitals:   01/24/16 1440  BP: 124/70  Weight: 135 lb (61.2 kg)  Height: 5\' 2"  (1.575 m)   Body mass index is 24.69 kg/m.  General appearance:  Normal affect, orientation and appearance. Skin: Grossly normal HEENT: Without gross lesions.  No cervical or supraclavicular adenopathy. Thyroid normal.  Lungs:  Clear without wheezing, rales or rhonchi Cardiac: RR, without RMG Abdominal:  Soft, nontender, without masses, guarding, rebound, organomegaly or hernia Breasts:  Examined lying and sitting without masses, retractions, discharge or axillary adenopathy. Pelvic:  Ext, BUS, Vagina with atrophic changes  Cervix with atrophic changes  Uterus anteverted, normal size, shape and contour, midline and mobile nontender   Adnexa without masses or tenderness    Anus and perineum normal   Rectovaginal normal sphincter tone without palpated masses or tenderness.    Assessment/Plan:  70 y.o. [id number] female for breast and pelvic exam.   1. Postmenopausal/atrophic genital changes/HRT. Continues on Climara Pro 0.045/ 0.015.  Has tried weaning with unacceptable hot flushes and sweats. I reviewed the most current 2017 NAMS guidelines on HRT. I reviewed possible benefits to include symptom relief as well as cardiovascular/bone health if initiated early versus risks to include thrombosis such as stroke heart attack DVT and breast cancer. Benefits of transdermal absorption from a thrombosis standpoint discussed. After a lengthy discussion the patient feels  strongly that she wants to continue understanding and accepting the risks. Refill 1 year provided. She's done no bleeding and knows the importance to report any bleeding. 2. Mammography 12/2015. Continue with annual mammography when due. SBE monthly reviewed. 3. DEXA 2010 normal. Recommend repeat DEXA now she is turning 70 and she agrees to schedule. Increased calcium vitamin D. 4. Colonoscopy 2014. Repeat at their recommended interval. 5. Pap smear 2015. No Pap smear done today. No history of abnormal Pap smears. Per current screening guidelines options to stop screening issues over the age of 55 reviewed. Will readdress on an annual basis. 6. Health maintenance. No routine lab work done as patient reports is done elsewhere. Follow up 1 year, sooner as needed.  10 minutes of my time in excess of her breast and pelvic exam was spent in direct face to face counseling and coordination of care in regards to her hormone replacement therapy, review of the risks versus benefits and the latest 2017 guidelines.Anastasio Auerbach MD, 3:26 PM 01/24/2016

## 2016-01-31 DIAGNOSIS — Z0000 Encounter for general adult medical examination without abnormal findings: Secondary | ICD-10-CM | POA: Diagnosis not present

## 2016-01-31 DIAGNOSIS — K219 Gastro-esophageal reflux disease without esophagitis: Secondary | ICD-10-CM | POA: Diagnosis not present

## 2016-01-31 DIAGNOSIS — F325 Major depressive disorder, single episode, in full remission: Secondary | ICD-10-CM | POA: Diagnosis not present

## 2016-01-31 DIAGNOSIS — R739 Hyperglycemia, unspecified: Secondary | ICD-10-CM | POA: Diagnosis not present

## 2016-02-02 DIAGNOSIS — K219 Gastro-esophageal reflux disease without esophagitis: Secondary | ICD-10-CM | POA: Diagnosis not present

## 2016-02-02 DIAGNOSIS — F325 Major depressive disorder, single episode, in full remission: Secondary | ICD-10-CM | POA: Diagnosis not present

## 2016-02-02 DIAGNOSIS — R739 Hyperglycemia, unspecified: Secondary | ICD-10-CM | POA: Diagnosis not present

## 2016-02-24 DIAGNOSIS — M7062 Trochanteric bursitis, left hip: Secondary | ICD-10-CM | POA: Diagnosis not present

## 2016-02-25 DIAGNOSIS — M9903 Segmental and somatic dysfunction of lumbar region: Secondary | ICD-10-CM | POA: Diagnosis not present

## 2016-02-25 DIAGNOSIS — M9905 Segmental and somatic dysfunction of pelvic region: Secondary | ICD-10-CM | POA: Diagnosis not present

## 2016-02-25 DIAGNOSIS — M955 Acquired deformity of pelvis: Secondary | ICD-10-CM | POA: Diagnosis not present

## 2016-02-25 DIAGNOSIS — M5114 Intervertebral disc disorders with radiculopathy, thoracic region: Secondary | ICD-10-CM | POA: Diagnosis not present

## 2016-04-26 HISTORY — PX: BLEPHAROPLASTY: SUR158

## 2016-07-09 DIAGNOSIS — H16141 Punctate keratitis, right eye: Secondary | ICD-10-CM | POA: Diagnosis not present

## 2016-07-23 DIAGNOSIS — M79671 Pain in right foot: Secondary | ICD-10-CM | POA: Diagnosis not present

## 2016-07-23 DIAGNOSIS — M2031 Hallux varus (acquired), right foot: Secondary | ICD-10-CM | POA: Diagnosis not present

## 2016-07-23 DIAGNOSIS — M79672 Pain in left foot: Secondary | ICD-10-CM | POA: Diagnosis not present

## 2016-07-23 DIAGNOSIS — D2372 Other benign neoplasm of skin of left lower limb, including hip: Secondary | ICD-10-CM | POA: Diagnosis not present

## 2016-08-13 DIAGNOSIS — M79672 Pain in left foot: Secondary | ICD-10-CM | POA: Diagnosis not present

## 2016-08-13 DIAGNOSIS — D2372 Other benign neoplasm of skin of left lower limb, including hip: Secondary | ICD-10-CM | POA: Diagnosis not present

## 2016-09-13 DIAGNOSIS — R739 Hyperglycemia, unspecified: Secondary | ICD-10-CM | POA: Diagnosis not present

## 2016-09-13 DIAGNOSIS — Z0000 Encounter for general adult medical examination without abnormal findings: Secondary | ICD-10-CM | POA: Diagnosis not present

## 2016-09-13 DIAGNOSIS — K219 Gastro-esophageal reflux disease without esophagitis: Secondary | ICD-10-CM | POA: Diagnosis not present

## 2016-09-13 DIAGNOSIS — F325 Major depressive disorder, single episode, in full remission: Secondary | ICD-10-CM | POA: Diagnosis not present

## 2016-09-24 DIAGNOSIS — L03032 Cellulitis of left toe: Secondary | ICD-10-CM | POA: Diagnosis not present

## 2016-09-24 DIAGNOSIS — J069 Acute upper respiratory infection, unspecified: Secondary | ICD-10-CM | POA: Diagnosis not present

## 2016-11-29 DIAGNOSIS — Y92009 Unspecified place in unspecified non-institutional (private) residence as the place of occurrence of the external cause: Secondary | ICD-10-CM | POA: Diagnosis not present

## 2016-11-29 DIAGNOSIS — M5114 Intervertebral disc disorders with radiculopathy, thoracic region: Secondary | ICD-10-CM | POA: Diagnosis not present

## 2016-11-29 DIAGNOSIS — M545 Low back pain: Secondary | ICD-10-CM | POA: Diagnosis not present

## 2016-11-29 DIAGNOSIS — M9903 Segmental and somatic dysfunction of lumbar region: Secondary | ICD-10-CM | POA: Diagnosis not present

## 2016-11-29 DIAGNOSIS — W182XXA Fall in (into) shower or empty bathtub, initial encounter: Secondary | ICD-10-CM | POA: Diagnosis not present

## 2016-11-29 DIAGNOSIS — S5012XA Contusion of left forearm, initial encounter: Secondary | ICD-10-CM | POA: Diagnosis not present

## 2016-11-29 DIAGNOSIS — M955 Acquired deformity of pelvis: Secondary | ICD-10-CM | POA: Diagnosis not present

## 2016-11-29 DIAGNOSIS — M9905 Segmental and somatic dysfunction of pelvic region: Secondary | ICD-10-CM | POA: Diagnosis not present

## 2016-12-03 DIAGNOSIS — M5114 Intervertebral disc disorders with radiculopathy, thoracic region: Secondary | ICD-10-CM | POA: Diagnosis not present

## 2016-12-03 DIAGNOSIS — M955 Acquired deformity of pelvis: Secondary | ICD-10-CM | POA: Diagnosis not present

## 2016-12-03 DIAGNOSIS — M9903 Segmental and somatic dysfunction of lumbar region: Secondary | ICD-10-CM | POA: Diagnosis not present

## 2016-12-03 DIAGNOSIS — M9905 Segmental and somatic dysfunction of pelvic region: Secondary | ICD-10-CM | POA: Diagnosis not present

## 2017-01-03 DIAGNOSIS — H2513 Age-related nuclear cataract, bilateral: Secondary | ICD-10-CM | POA: Diagnosis not present

## 2017-02-04 DIAGNOSIS — Z1231 Encounter for screening mammogram for malignant neoplasm of breast: Secondary | ICD-10-CM | POA: Diagnosis not present

## 2017-02-15 ENCOUNTER — Other Ambulatory Visit: Payer: Self-pay | Admitting: Gynecology

## 2017-02-18 NOTE — Telephone Encounter (Signed)
Patient annual on 02/21/17

## 2017-02-21 ENCOUNTER — Encounter: Payer: Self-pay | Admitting: Gynecology

## 2017-02-21 ENCOUNTER — Ambulatory Visit: Payer: PPO | Admitting: Gynecology

## 2017-02-21 VITALS — BP 120/74 | Ht 62.5 in | Wt 142.0 lb

## 2017-02-21 DIAGNOSIS — N952 Postmenopausal atrophic vaginitis: Secondary | ICD-10-CM

## 2017-02-21 DIAGNOSIS — Z01411 Encounter for gynecological examination (general) (routine) with abnormal findings: Secondary | ICD-10-CM | POA: Diagnosis not present

## 2017-02-21 DIAGNOSIS — Z124 Encounter for screening for malignant neoplasm of cervix: Secondary | ICD-10-CM

## 2017-02-21 DIAGNOSIS — Z79890 Hormone replacement therapy (postmenopausal): Secondary | ICD-10-CM

## 2017-02-21 MED ORDER — ESTRADIOL-LEVONORGESTREL 0.045-0.015 MG/DAY TD PTWK: MEDICATED_PATCH | TRANSDERMAL | 12 refills | Status: DC

## 2017-02-21 NOTE — Patient Instructions (Signed)
Follow-up for bone density as scheduled.    Follow-up in 1 year for annual exam, sooner as needed.

## 2017-02-21 NOTE — Addendum Note (Signed)
Addended by: Nelva Nay on: 02/21/2017 12:34 PM   Modules accepted: Orders

## 2017-02-21 NOTE — Progress Notes (Signed)
    Kimberly Leon February 14, 1946 [id number]        71 y.o.  [id number] for breast and pelvic exam.  Doing well without gynecologic complaints  Past medical history,surgical history, problem list, medications, allergies, family history and social history were all reviewed and documented as reviewed in the EPIC chart.  ROS:  Performed with pertinent positives and negatives included in the history, assessment and plan.   Additional significant findings : None   Exam: Caryn Bee assistant Vitals:   02/21/17 1134  BP: 120/74  Weight: 142 lb (64.4 kg)  Height: 5' 2.5" (1.588 m)   Body mass index is 25.56 kg/m.  General appearance:  Normal affect, orientation and appearance. Skin: Grossly normal HEENT: Without gross lesions.  No cervical or supraclavicular adenopathy. Thyroid normal.  Lungs:  Clear without wheezing, rales or rhonchi Cardiac: RR, without RMG Abdominal:  Soft, nontender, without masses, guarding, rebound, organomegaly or hernia Breasts:  Examined lying and sitting without masses, retractions, discharge or axillary adenopathy. Pelvic:  Ext, BUS, Vagina: With atrophic changes  Cervix: With atrophic changes.  Pap smear done  Uterus: Anteverted, normal size, shape and contour, midline and mobile nontender   Adnexa: Without masses or tenderness    Anus and perineum: Normal   Rectovaginal: Normal sphincter tone without palpated masses or tenderness.    Assessment/Plan:  71 y.o. [id number] female for breast and pelvic exam.   1. Postmenopausal/atrophic genital changes/HRT.  Continues on Climara pro 0.045/0.015.  We again reviewed the whole issue of HRT to include risks such as stroke heart attack DVT and breast cancer.  Benefits as far as symptom relief cardiovascular and bone health when started early also discussed.  Went to wean all reviewed.  I also reviewed how to wean as far as slowly.  At this point the patient wants to continue and I refilled her times 1 year.  She is done  no vaginal bleeding and knows to call if she does any bleeding. 2. DEXA 2010 normal.  Recommend follow-up DEXA now.  She failed to do so last year but agrees to schedule now. 3. Pap smear 2015.  Pap smear done today.  No history of abnormal Pap smears.  Options to stop her current screening guidelines based on age reviewed.  Will readdress on an annual basis. 4. Colonoscopy 2014.  Repeat at their recommended interval. 5. Mammography 01/2017.  Continue with annual mammography next year.  Breast exam normal today. 6. Health maintenance.  No routine lab work done as patient does this elsewhere.  Follow-up 1 year, sooner as needed.   Anastasio Auerbach MD, 12:10 PM 02/21/2017

## 2017-02-23 LAB — PAP IG W/ RFLX HPV ASCU

## 2017-02-25 ENCOUNTER — Other Ambulatory Visit: Payer: Self-pay | Admitting: Gynecology

## 2017-02-25 DIAGNOSIS — R87619 Unspecified abnormal cytological findings in specimens from cervix uteri: Secondary | ICD-10-CM

## 2017-03-07 ENCOUNTER — Other Ambulatory Visit: Payer: Self-pay | Admitting: Gynecology

## 2017-03-07 DIAGNOSIS — R87618 Other abnormal cytological findings on specimens from cervix uteri: Secondary | ICD-10-CM

## 2017-03-21 DIAGNOSIS — Z0000 Encounter for general adult medical examination without abnormal findings: Secondary | ICD-10-CM | POA: Diagnosis not present

## 2017-03-21 DIAGNOSIS — E7800 Pure hypercholesterolemia, unspecified: Secondary | ICD-10-CM | POA: Diagnosis not present

## 2017-03-21 DIAGNOSIS — R739 Hyperglycemia, unspecified: Secondary | ICD-10-CM | POA: Diagnosis not present

## 2017-03-28 ENCOUNTER — Ambulatory Visit: Payer: PPO | Admitting: Gynecology

## 2017-03-28 ENCOUNTER — Other Ambulatory Visit: Payer: PPO

## 2017-04-03 DIAGNOSIS — F325 Major depressive disorder, single episode, in full remission: Secondary | ICD-10-CM | POA: Diagnosis not present

## 2017-04-03 DIAGNOSIS — M159 Polyosteoarthritis, unspecified: Secondary | ICD-10-CM | POA: Diagnosis not present

## 2017-04-03 DIAGNOSIS — R739 Hyperglycemia, unspecified: Secondary | ICD-10-CM | POA: Diagnosis not present

## 2017-04-03 DIAGNOSIS — K219 Gastro-esophageal reflux disease without esophagitis: Secondary | ICD-10-CM | POA: Diagnosis not present

## 2017-04-08 DIAGNOSIS — G5762 Lesion of plantar nerve, left lower limb: Secondary | ICD-10-CM | POA: Diagnosis not present

## 2017-04-08 DIAGNOSIS — M898X7 Other specified disorders of bone, ankle and foot: Secondary | ICD-10-CM | POA: Diagnosis not present

## 2017-04-08 DIAGNOSIS — M79672 Pain in left foot: Secondary | ICD-10-CM | POA: Diagnosis not present

## 2017-04-11 ENCOUNTER — Ambulatory Visit (INDEPENDENT_AMBULATORY_CARE_PROVIDER_SITE_OTHER): Payer: PPO

## 2017-04-11 ENCOUNTER — Other Ambulatory Visit: Payer: Self-pay | Admitting: Gynecology

## 2017-04-11 DIAGNOSIS — Z01411 Encounter for gynecological examination (general) (routine) with abnormal findings: Secondary | ICD-10-CM

## 2017-04-11 DIAGNOSIS — Z780 Asymptomatic menopausal state: Secondary | ICD-10-CM | POA: Diagnosis not present

## 2017-04-12 ENCOUNTER — Encounter: Payer: Self-pay | Admitting: Gynecology

## 2017-04-24 ENCOUNTER — Ambulatory Visit (INDEPENDENT_AMBULATORY_CARE_PROVIDER_SITE_OTHER): Payer: PPO

## 2017-04-24 ENCOUNTER — Encounter: Payer: Self-pay | Admitting: Gynecology

## 2017-04-24 ENCOUNTER — Other Ambulatory Visit: Payer: Self-pay | Admitting: Gynecology

## 2017-04-24 ENCOUNTER — Other Ambulatory Visit: Payer: PPO

## 2017-04-24 ENCOUNTER — Ambulatory Visit (INDEPENDENT_AMBULATORY_CARE_PROVIDER_SITE_OTHER): Payer: PPO | Admitting: Gynecology

## 2017-04-24 ENCOUNTER — Ambulatory Visit: Payer: PPO | Admitting: Gynecology

## 2017-04-24 VITALS — BP 114/72

## 2017-04-24 DIAGNOSIS — R87618 Other abnormal cytological findings on specimens from cervix uteri: Secondary | ICD-10-CM | POA: Diagnosis not present

## 2017-04-24 DIAGNOSIS — R87619 Unspecified abnormal cytological findings in specimens from cervix uteri: Secondary | ICD-10-CM

## 2017-04-24 DIAGNOSIS — D251 Intramural leiomyoma of uterus: Secondary | ICD-10-CM

## 2017-04-24 DIAGNOSIS — N9489 Other specified conditions associated with female genital organs and menstrual cycle: Secondary | ICD-10-CM

## 2017-04-24 DIAGNOSIS — N719 Inflammatory disease of uterus, unspecified: Secondary | ICD-10-CM | POA: Diagnosis not present

## 2017-04-24 DIAGNOSIS — N840 Polyp of corpus uteri: Secondary | ICD-10-CM

## 2017-04-24 NOTE — Progress Notes (Signed)
    Kimberly Leon July 14, 1945 [id number]        71 y.o.  G2P2002 presents for sonohysterogram.  Recently had her annual exam this past fall where Pap smear was normal but did show benign appearing endometrial cells.  No bleeding.  Had been on Climara Pro but has discontinued this since I saw her and is doing well without hot flushes and sweats.  History of endometrial polyps in the past status post hysteroscopy D&C 2006.  Past medical history,surgical history, problem list, medications, allergies, family history and social history were all reviewed and documented in the EPIC chart.  Directed ROS with pertinent positives and negatives documented in the history of present illness/assessment and plan.  Exam: [place] assistant Vitals:   04/24/17 0949  BP: 114/72   General appearance:  Normal Abdomen soft nontender without masses guarding rebound Pelvic external BUS vagina normal with atrophic changes.  Cervix normal with atrophic changes.  Uterus grossly normal size midline mobile nontender.  Adnexa without masses or tenderness.  Ultrasound transvaginal and transabdominal shows uterus retroverted normal size with 2 small myomas noted at 16 mm and 13 mm.  Endometrial echo 4.5 mm.  Right and left ovaries visualized.  Cul-de-sac negative.  Sonohysterogram performed, sterile technique, single-tooth tenaculum anterior lip stabilization, minimal cervical dilatation needed for catheter placement which was subsequently performed without difficulty.  Good distention noted with 22 x 14 x 10 mm defect.  Endometrial biopsy taken.  Patient tolerated well.  Assessment/Plan:  72 y.o. [id number] with endometrial cells on Pap smear.  No history of bleeding.  Recently stopped her HRT.  Doing well without symptoms.  Sonohysterogram shows endometrial polyp.  Discussed differential to include benign, hyperplastic, carcinoma.  Options for management reviewed and ultimately we decided to proceed with hysteroscopy D&C.   I reviewed the procedure with her to include the intraoperative and postoperative courses.  Risks to include bleeding necessitating transfusion, infection necessitating antibiotic treatment, damage to internal organs including vagina cervix uterus perforation with internal organ damage including bowel bladder ureters vessels and nerves necessitating major exploratory reparative surgeries and future reparative surgeries including ostomy formation.  Patient wants to proceed with surgery and will schedule this at her convenience.  She will follow-up on the biopsy from the ultrasound today.  She will follow-up for preoperative consult before her surgery.    Anastasio Auerbach MD, 10:03 AM 04/24/2017

## 2017-04-24 NOTE — Patient Instructions (Signed)
Office will call you with pathology results from the ultrasound.  Office will call you to schedule the hysteroscopy D&C.

## 2017-04-25 ENCOUNTER — Telehealth: Payer: Self-pay

## 2017-04-25 MED ORDER — MISOPROSTOL 200 MCG PO TABS: ORAL_TABLET | ORAL | 0 refills | Status: DC

## 2017-04-25 NOTE — Telephone Encounter (Signed)
I called patient and spoke with her about scheduling surgery. She is leaving for [facility] trip on 05/10/17 and wants to wait for March to have surgery. I scheduled her for March 11 at [place] at [facility].  I will mail her a pamphlet from [facility].  Her ins pays professional fees at 100% so she will have no surgery prepymt due.  I advised her regarding need for Cytotec vaginally hs before surgery and Rx was sent. I did warn her that it may cause some cramping or bleeding but no worries just means it is working.

## 2017-04-26 ENCOUNTER — Telehealth: Payer: Self-pay

## 2017-04-26 NOTE — Telephone Encounter (Signed)
Called patient and left message for her to call me at her convenience.   (I want to make her aware her surgery case has been moved from [facility] to the Main OR at [facility].)

## 2017-05-17 ENCOUNTER — Encounter (HOSPITAL_BASED_OUTPATIENT_CLINIC_OR_DEPARTMENT_OTHER): Payer: Self-pay

## 2017-05-27 ENCOUNTER — Inpatient Hospital Stay (HOSPITAL_COMMUNITY): Admission: RE | Admit: 2017-05-27 | Payer: Medicare PPO | Source: Ambulatory Visit

## 2017-05-27 ENCOUNTER — Encounter (HOSPITAL_BASED_OUTPATIENT_CLINIC_OR_DEPARTMENT_OTHER): Payer: Self-pay | Admitting: *Deleted

## 2017-05-27 ENCOUNTER — Other Ambulatory Visit: Payer: Self-pay

## 2017-05-27 NOTE — Progress Notes (Signed)
SPOKE W/ PT VIA PHONE FOR PRE-OP INTERVIEW.  NPO AFTER MN.  ARRIVE AT [id number].  GETTING CBC AND CMET DONE Wednesday 05-29-2017 AT 1300.  WILL TAKE CELEXA AND NEXIUM AM DOS W/ SIPS OF WATER.

## 2017-05-29 ENCOUNTER — Ambulatory Visit: Payer: PPO | Admitting: Gynecology

## 2017-05-29 ENCOUNTER — Encounter: Payer: Self-pay | Admitting: Gynecology

## 2017-05-29 ENCOUNTER — Encounter (HOSPITAL_COMMUNITY)
Admission: RE | Admit: 2017-05-29 | Discharge: 2017-05-29 | Disposition: A | Discharge status: Home / Self Care | Payer: PPO | Source: Ambulatory Visit | Attending: Gynecology | Admitting: Gynecology

## 2017-05-29 VITALS — BP 120/78

## 2017-05-29 DIAGNOSIS — N840 Polyp of corpus uteri: Secondary | ICD-10-CM

## 2017-05-29 DIAGNOSIS — Z01818 Encounter for other preprocedural examination: Secondary | ICD-10-CM | POA: Diagnosis not present

## 2017-05-29 DIAGNOSIS — N393 Stress incontinence (female) (male): Secondary | ICD-10-CM

## 2017-05-29 LAB — COMPREHENSIVE METABOLIC PANEL
ALT: 25 U/L (ref 14–54)
ANION GAP: 8 (ref 5–15)
AST: 22 U/L (ref 15–41)
Albumin: 4.2 g/dL (ref 3.5–5.0)
Alkaline Phosphatase: 62 U/L (ref 38–126)
BUN: 17 mg/dL (ref 6–20)
CALCIUM: 9.2 mg/dL (ref 8.9–10.3)
CO2: 26 mmol/L (ref 22–32)
Chloride: 104 mmol/L (ref 101–111)
Creatinine, Ser: 0.68 mg/dL (ref 0.44–1.00)
Glucose, Bld: 105 mg/dL — ABNORMAL HIGH (ref 65–99)
Potassium: 4.5 mmol/L (ref 3.5–5.1)
Sodium: 138 mmol/L (ref 135–145)
Total Bilirubin: 0.7 mg/dL (ref 0.3–1.2)
Total Protein: 7.1 g/dL (ref 6.5–8.1)

## 2017-05-29 LAB — CBC
HCT: 41.7 % (ref 36.0–46.0)
Hemoglobin: 13.8 g/dL (ref 12.0–15.0)
MCH: 30.8 pg (ref 26.0–34.0)
MCHC: 33.1 g/dL (ref 30.0–36.0)
MCV: 93.1 fL (ref 78.0–100.0)
PLATELETS: 263 10*3/uL (ref 150–400)
RBC: 4.48 MIL/uL (ref 3.87–5.11)
RDW: 13.2 % (ref 11.5–15.5)
WBC: 8 10*3/uL (ref 4.0–10.5)

## 2017-05-29 NOTE — Patient Instructions (Signed)
Followup for surgery as scheduled. 

## 2017-05-29 NOTE — Progress Notes (Signed)
Kimberly Leon 03-21-1946 [id number]        72 y.o.  [id number] presents for her preoperative visit for hysteroscopy D&C resection of endometrial polyp.  Also notes stress incontinence with loss of urine with laughing coughing sneezing.  Had been present for 2 years or so but now a lot worse since stopping her HRT.  No frequency dysuria low back pain fever or chills.  No significant urgency symptoms.  No blood in her urine.  No vaginal discharge itching or odor.  Had been on Climara Pro one half of the patch but weaned herself off and is now having excessive hot flushes and sweats.    Past medical history,surgical history, problem list, medications, allergies, family history and social history were all reviewed and documented in the EPIC chart.  Directed ROS with pertinent positives and negatives documented in the history of present illness/assessment and plan.  Exam: Caryn Bee assistant Vitals:   05/29/17 1427  BP: 120/78   General appearance:  Normal HEENT normal Lungs clear Cardiac regular rate no rubs murmurs or gallops Abdomen soft nontender without masses guarding rebound Pelvic external BUS vagina with atrophic changes.  Cervix with atrophic changes.  Uterus normal size midline mobile nontender.  Adnexa without masses or tenderness.  Assessment/Plan:  72 y.o. [id number] with history of Pap smear and annual exam showing benign appearing endometrial cells.  Had been on HRT of Climara Pro but discontinued this.  No bleeding or other symptoms.  Follow-up sonohysterogram which showed endometrial echo 4.5 mm and subsequent distention showing a 22 x 14 x 10 mm defect consistent with endometrial polyp.  Endometrial biopsy showed atrophic endometrium.  Patient is scheduled for hysteroscopy D&C with resection of any endometrial defects.  I reviewed the proposed surgery with the patient to include the expected intraoperative and postoperative courses as well as the recovery period. The use of the  hysteroscope, resectoscope and the D&C portion were all discussed. The risks of surgery to include infection, prolonged antibiotics, hemorrhage necessitating transfusion and the risks of transfusion, including transfusion reaction, hepatitis, HIV, mad cow disease and other unknown entities were all discussed understood and accepted. The risk of damage to internal organs during the procedure, either immediately recognized or delay recognized, including vagina, cervix, uterus, possible perforation causing damage to bowel, bladder, ureters, vessels and nerves necessitating major exploratory reparative surgery and future reparative surgeries including bladder repair, ureteral damage repair, bowel resection, ostomy formation was also discussed understood and accepted.  Patient understands that we may not find any pathology up to and including endometrial cancer as possibilities.  The patient's questions were answered to her satisfaction and she is ready to proceed with surgery.  Discussed her worsening hot flushes and sweats and have decided to reinitiate her Climara Pro one half patch and see how she does with this.  We discussed the risks including thrombosis such as stroke heart attack DVT in the breast cancer issue.  I also reviewed her stress incontinence and we discussed options to include Kegel exercises, pessaries or other devices for urethral support and surgery.  At this point I recommended she reinitiate her HRT and then reassess how she is doing and if significant incontinence continues then referral to urology.  Patient agrees with the plan.  We will check baseline urine analysis today.  Greater than 50% of my 5-minute office visit was spent in direct face to face counseling and coordination of care with the patient in regards to her endometrial polyp, hormone  replacement therapy and stress urinary incontinence.     Anastasio Auerbach MD, 3:08 PM 05/29/2017

## 2017-05-29 NOTE — H&P (Signed)
Kimberly Leon 02-Feb-1946 [id number]   History and Physical  Chief complaint:  Endometrial polyp  History of present illness: 72 y.o. G2P2002 with history of Pap smear and annual exam showing benign appearing endometrial cells.  Had been on HRT of Climara Pro but discontinued this.  No bleeding or other symptoms.  Follow-up sonohysterogram which showed endometrial echo 4.5 mm and subsequent distention showing a 22 x 14 x 10 mm defect consistent with endometrial polyp.  Endometrial biopsy showed atrophic endometrium.  Patient is scheduled for hysteroscopy D&C with resection of any endometrial defects.   Past Medical History:  Diagnosis Date  . Allergic rhinitis   . Arthritis   . Endometrial polyp   . Family history of adverse reaction to anesthesia    mother-- ponv  . GERD (gastroesophageal reflux disease)   . Sinus congestion   . Wears contact lenses     Past Surgical History:  Procedure Laterality Date  . APPENDECTOMY  1967  . BLEPHAROPLASTY Bilateral 04/2016   upper eyelid  . COLONOSCOPY  2014  . COMBINED HYSTEROSCOPY DIAGNOSTIC / D&C  08-24-2004   dr Cherylann Banas [facility]  . EXCISION MORTON'S NEUROMA Right 2016  . FOOT SURGERY Bilateral 03/1998   removal bone spurs  . ROTATOR CUFF REPAIR Right 2010  . TUBAL LIGATION  yrs ago    Family History  Problem Relation Age of Onset  . Hypertension Mother   . Diabetes Mother   . Heart disease Mother   . Heart disease Father   . Breast cancer Maternal Aunt        Age 66's    Social History:  reports that  has never smoked. she has never used smokeless tobacco. She reports that she drinks about 4.2 oz of alcohol per week. She reports that she does not use drugs.  Allergies: No Known Allergies  Medications: See epic for most current list  ROS:  Was performed and pertinent positives and negatives are included in the history of present illness.  Exam:  Caryn Bee assistant Vitals:   05/29/17 1427  BP: 120/78   General  appearance:  Normal HEENT normal Lungs clear Cardiac regular rate no rubs murmurs or gallops Abdomen soft nontender without masses guarding rebound Pelvic external BUS vagina with atrophic changes.  Cervix with atrophic changes.  Uterus normal size midline mobile nontender.  Adnexa without masses or tenderness.  Assessment/Plan:  72 y.o. [id number] with history of Pap smear and annual exam showing benign appearing endometrial cells.  Had been on HRT of Climara Pro but discontinued this.  No bleeding or other symptoms.  Follow-up sonohysterogram which showed endometrial echo 4.5 mm and subsequent distention showing a 22 x 14 x 10 mm defect consistent with endometrial polyp.  Endometrial biopsy showed atrophic endometrium.  Patient is scheduled for hysteroscopy D&C with resection of any endometrial defects.  I reviewed the proposed surgery with the patient to include the expected intraoperative and postoperative courses as well as the recovery period. The use of the hysteroscope, resectoscope and the D&C portion were all discussed. The risks of surgery to include infection, prolonged antibiotics, hemorrhage necessitating transfusion and the risks of transfusion, including transfusion reaction, hepatitis, HIV, mad cow disease and other unknown entities were all discussed understood and accepted. The risk of damage to internal organs during the procedure, either immediately recognized or delay recognized, including vagina, cervix, uterus, possible perforation causing damage to bowel, bladder, ureters, vessels and nerves necessitating major exploratory reparative surgery and future reparative surgeries  including bladder repair, ureteral damage repair, bowel resection, ostomy formation was also discussed understood and accepted.  Patient understands that we may not find any pathology up to and including endometrial cancer as possibilities.  The patient's questions were answered to her satisfaction and she is ready to  proceed with surgery.     Anastasio Auerbach MD, 3:19 PM 05/29/2017

## 2017-05-30 LAB — URINALYSIS, COMPLETE W/RFL CULTURE
BILIRUBIN URINE: NEGATIVE
Bacteria, UA: NONE SEEN /HPF
GLUCOSE, UA: NEGATIVE
Hgb urine dipstick: NEGATIVE
Hyaline Cast: NONE SEEN /LPF
Ketones, ur: NEGATIVE
Leukocyte Esterase: NEGATIVE
NITRITES URINE, INITIAL: NEGATIVE
PH: 6 (ref 5.0–8.0)
PROTEIN: NEGATIVE
RBC / HPF: NONE SEEN /HPF (ref 0–2)
Specific Gravity, Urine: 1.005 (ref 1.001–1.03)
Squamous Epithelial / LPF: NONE SEEN /HPF (ref <=5)
WBC UA: NONE SEEN /HPF (ref 0–5)

## 2017-05-30 LAB — URINE CULTURE
MICRO NUMBER:: 90288044
Result:: NO GROWTH
SPECIMEN QUALITY:: ADEQUATE

## 2017-05-30 LAB — NO CULTURE INDICATED

## 2017-06-02 NOTE — Anesthesia Preprocedure Evaluation (Addendum)
Anesthesia Evaluation  Patient identified by MRN, date of birth, ID band Patient awake    Reviewed: Allergy & Precautions, NPO status , Patient's Chart, lab work & pertinent test results  History of Anesthesia Complications (+) PONV and Family history of anesthesia reaction  Airway Mallampati: II  TM Distance: >3 FB Neck ROM: Full    Dental no notable dental hx. (+) Dental Advisory Given,    Pulmonary neg pulmonary ROS,    Pulmonary exam normal breath sounds clear to auscultation       Cardiovascular negative cardio ROS Normal cardiovascular exam Rhythm:Regular Rate:Normal     Neuro/Psych negative neurological ROS  negative psych ROS   GI/Hepatic negative GI ROS, Neg liver ROS, GERD  Medicated,  Endo/Other  negative endocrine ROS  Renal/GU negative Renal ROS  negative genitourinary   Musculoskeletal negative musculoskeletal ROS (+) Arthritis , Osteoarthritis,    Abdominal   Peds negative pediatric ROS (+)  Hematology negative hematology ROS (+)   Anesthesia Other Findings   Reproductive/Obstetrics negative OB ROS                            Anesthesia Physical Anesthesia Plan  ASA: II  Anesthesia Plan: General   Post-op Pain Management:    Induction: Intravenous  PONV Risk Score and Plan: 4 or greater and Ondansetron, Dexamethasone, Treatment may vary due to age or medical condition, Midazolam and Propofol infusion  Airway Management Planned: LMA  Additional Equipment:   Intra-op Plan:   Post-operative Plan: Extubation in OR  Informed Consent: I have reviewed the patients History and Physical, chart, labs and discussed the procedure including the risks, benefits and alternatives for the proposed anesthesia with the patient or authorized representative who has indicated his/her understanding and acceptance.     Plan Discussed with: CRNA, Surgeon and  Anesthesiologist  Anesthesia Plan Comments: ( )        Anesthesia Quick Evaluation

## 2017-06-03 ENCOUNTER — Encounter (HOSPITAL_BASED_OUTPATIENT_CLINIC_OR_DEPARTMENT_OTHER): Payer: Self-pay

## 2017-06-03 ENCOUNTER — Ambulatory Visit (HOSPITAL_COMMUNITY)
Admission: RE | Admit: 2017-06-03 | Discharge: 2017-06-03 | Disposition: A | Discharge status: Home / Self Care | Payer: PPO | Source: Ambulatory Visit | Attending: Gynecology | Admitting: Gynecology

## 2017-06-03 ENCOUNTER — Ambulatory Visit (HOSPITAL_BASED_OUTPATIENT_CLINIC_OR_DEPARTMENT_OTHER): Payer: PPO | Admitting: Anesthesiology

## 2017-06-03 ENCOUNTER — Encounter (HOSPITAL_BASED_OUTPATIENT_CLINIC_OR_DEPARTMENT_OTHER)
Admission: RE | Disposition: A | Discharge status: Home / Self Care | Payer: Self-pay | Source: Ambulatory Visit | Attending: Gynecology

## 2017-06-03 DIAGNOSIS — Z791 Long term (current) use of non-steroidal anti-inflammatories (NSAID): Secondary | ICD-10-CM | POA: Diagnosis not present

## 2017-06-03 DIAGNOSIS — N840 Polyp of corpus uteri: Secondary | ICD-10-CM | POA: Insufficient documentation

## 2017-06-03 DIAGNOSIS — Z7951 Long term (current) use of inhaled steroids: Secondary | ICD-10-CM | POA: Insufficient documentation

## 2017-06-03 DIAGNOSIS — M5442 Lumbago with sciatica, left side: Secondary | ICD-10-CM | POA: Diagnosis not present

## 2017-06-03 DIAGNOSIS — K219 Gastro-esophageal reflux disease without esophagitis: Secondary | ICD-10-CM | POA: Diagnosis not present

## 2017-06-03 DIAGNOSIS — Z79899 Other long term (current) drug therapy: Secondary | ICD-10-CM | POA: Insufficient documentation

## 2017-06-03 DIAGNOSIS — M25551 Pain in right hip: Secondary | ICD-10-CM | POA: Diagnosis not present

## 2017-06-03 DIAGNOSIS — J309 Allergic rhinitis, unspecified: Secondary | ICD-10-CM | POA: Insufficient documentation

## 2017-06-03 HISTORY — DX: Allergic rhinitis, unspecified: J30.9

## 2017-06-03 HISTORY — DX: Presence of spectacles and contact lenses: Z97.3

## 2017-06-03 HISTORY — DX: Family history of other specified conditions: Z84.89

## 2017-06-03 HISTORY — DX: Gastro-esophageal reflux disease without esophagitis: K21.9

## 2017-06-03 HISTORY — PX: DILATATION & CURETTAGE/HYSTEROSCOPY WITH MYOSURE: SHX6511

## 2017-06-03 HISTORY — DX: Nasal congestion: R09.81

## 2017-06-03 SURGERY — DILATATION & CURETTAGE/HYSTEROSCOPY WITH MYOSURE
Anesthesia: General | Site: Uterus

## 2017-06-03 MED ORDER — DEXAMETHASONE SODIUM PHOSPHATE 10 MG/ML IJ SOLN
INTRAMUSCULAR | Status: DC | PRN
  Administered 2017-06-03: 10 mg via INTRAVENOUS

## 2017-06-03 MED ORDER — LACTATED RINGERS IV SOLN
INTRAVENOUS | Status: DC
  Administered 2017-06-03 (×2): via INTRAVENOUS
  Filled 2017-06-03: qty 1000

## 2017-06-03 MED ORDER — KETOROLAC TROMETHAMINE 30 MG/ML IJ SOLN
INTRAMUSCULAR | Status: DC | PRN
  Administered 2017-06-03: 30 mg via INTRAVENOUS

## 2017-06-03 MED ORDER — LIDOCAINE HCL 1 % IJ SOLN
INTRAMUSCULAR | Status: DC | PRN
  Administered 2017-06-03: 10 mL

## 2017-06-03 MED ORDER — ONDANSETRON HCL 4 MG/2ML IJ SOLN
INTRAMUSCULAR | Status: AC
  Filled 2017-06-03: qty 2

## 2017-06-03 MED ORDER — PROPOFOL 10 MG/ML IV BOLUS
INTRAVENOUS | Status: DC | PRN
  Administered 2017-06-03: 160 mg via INTRAVENOUS

## 2017-06-03 MED ORDER — GLYCOPYRROLATE 0.2 MG/ML IV SOSY
PREFILLED_SYRINGE | INTRAVENOUS | Status: AC
  Filled 2017-06-03: qty 5

## 2017-06-03 MED ORDER — OXYCODONE-ACETAMINOPHEN 5-325 MG PO TABS: 1.0000 | ORAL_TABLET | ORAL | 0 refills | Status: DC | PRN

## 2017-06-03 MED ORDER — CEFOTETAN DISODIUM-DEXTROSE 2-2.08 GM-%(50ML) IV SOLR
INTRAVENOUS | Status: AC
  Filled 2017-06-03: qty 50

## 2017-06-03 MED ORDER — LIDOCAINE HCL (CARDIAC) 20 MG/ML IV SOLN
INTRAVENOUS | Status: AC
  Filled 2017-06-03: qty 5

## 2017-06-03 MED ORDER — KETOROLAC TROMETHAMINE 30 MG/ML IJ SOLN
INTRAMUSCULAR | Status: AC
  Filled 2017-06-03: qty 1

## 2017-06-03 MED ORDER — MEPERIDINE HCL 25 MG/ML IJ SOLN
6.2500 mg | INTRAMUSCULAR | Status: DC | PRN
  Filled 2017-06-03: qty 1

## 2017-06-03 MED ORDER — ONDANSETRON HCL 4 MG/2ML IJ SOLN
INTRAMUSCULAR | Status: AC
Start: 2017-06-03 — End: 2017-06-03
  Filled 2017-06-03: qty 2

## 2017-06-03 MED ORDER — GLYCOPYRROLATE 0.2 MG/ML IJ SOLN
INTRAMUSCULAR | Status: DC | PRN
  Administered 2017-06-03: 0.2 mg via INTRAVENOUS

## 2017-06-03 MED ORDER — SODIUM CHLORIDE 0.9 % IR SOLN
Status: DC | PRN
  Administered 2017-06-03: 3000 mL

## 2017-06-03 MED ORDER — PROPOFOL 10 MG/ML IV BOLUS
INTRAVENOUS | Status: AC
  Filled 2017-06-03: qty 20

## 2017-06-03 MED ORDER — SODIUM CHLORIDE 0.9 % IV SOLN
2.0000 g | INTRAVENOUS | Status: AC
  Administered 2017-06-03: 2 g via INTRAVENOUS
  Filled 2017-06-03: qty 2

## 2017-06-03 MED ORDER — FENTANYL CITRATE (PF) 100 MCG/2ML IJ SOLN
INTRAMUSCULAR | Status: AC
  Filled 2017-06-03: qty 2

## 2017-06-03 MED ORDER — DEXAMETHASONE SODIUM PHOSPHATE 10 MG/ML IJ SOLN
INTRAMUSCULAR | Status: AC
  Filled 2017-06-03: qty 1

## 2017-06-03 MED ORDER — MIDAZOLAM HCL 2 MG/2ML IJ SOLN
INTRAMUSCULAR | Status: AC
  Filled 2017-06-03: qty 2

## 2017-06-03 MED ORDER — LIDOCAINE 2% (20 MG/ML) 5 ML SYRINGE
INTRAMUSCULAR | Status: DC | PRN
  Administered 2017-06-03: 60 mg via INTRAVENOUS

## 2017-06-03 MED ORDER — FENTANYL CITRATE (PF) 100 MCG/2ML IJ SOLN
25.0000 ug | INTRAMUSCULAR | Status: DC | PRN
  Filled 2017-06-03: qty 1

## 2017-06-03 MED ORDER — FENTANYL CITRATE (PF) 100 MCG/2ML IJ SOLN
INTRAMUSCULAR | Status: DC | PRN
  Administered 2017-06-03: 75 ug via INTRAVENOUS

## 2017-06-03 MED ORDER — ONDANSETRON HCL 4 MG/2ML IJ SOLN
INTRAMUSCULAR | Status: DC | PRN
  Administered 2017-06-03: 4 mg via INTRAVENOUS

## 2017-06-03 SURGICAL SUPPLY — 14 items
CANISTER SUCT 3000ML PPV (MISCELLANEOUS) ×6 IMPLANT
CATH ROBINSON RED A/P 16FR (CATHETERS) ×3 IMPLANT
CLOTH BEACON ORANGE TIMEOUT ST (SAFETY) ×3 IMPLANT
DEVICE MYOSURE REACH (MISCELLANEOUS) ×3 IMPLANT
DILATOR CANAL MILEX (MISCELLANEOUS) IMPLANT
GLOVE BIO SURGEON STRL SZ7.5 (GLOVE) ×6 IMPLANT
GOWN STRL REUS W/TWL LRG LVL3 (GOWN DISPOSABLE) ×3 IMPLANT
IV NS IRRIG 3000ML ARTHROMATIC (IV SOLUTION) ×3 IMPLANT
KIT TURNOVER CYSTO (KITS) ×3 IMPLANT
PACK VAGINAL MINOR WOMEN LF (CUSTOM PROCEDURE TRAY) ×3 IMPLANT
PAD OB MATERNITY 4.3X12.25 (PERSONAL CARE ITEMS) ×3 IMPLANT
SEAL ROD LENS SCOPE MYOSURE (ABLATOR) ×3 IMPLANT
TUBING AQUILEX INFLOW (TUBING) ×3 IMPLANT
TUBING AQUILEX OUTFLOW (TUBING) ×3 IMPLANT

## 2017-06-03 NOTE — Discharge Instructions (Signed)
°  Post Anesthesia Home Care Instructions  Activity: Get plenty of rest for the remainder of the day. A responsible individual must stay with you for 24 hours following the procedure.  For the next 24 hours, DO NOT: -Drive a car -Paediatric nurse -Drink alcoholic beverages -Take any medication unless instructed by your physician -Make any legal decisions or sign important papers.  Meals: Start with liquid foods such as gelatin or soup. Progress to regular foods as tolerated. Avoid greasy, spicy, heavy foods. If nausea and/or vomiting occur, drink only clear liquids until the nausea and/or vomiting subsides. Call your physician if vomiting continues.  Special Instructions/Symptoms: Your throat may feel dry or sore from the anesthesia or the breathing tube placed in your throat during surgery. If this causes discomfort, gargle with warm salt water. The discomfort should disappear within 24 hours.  NO Advil, Aleve, Motrin, or Ibuprofen until 3 PM if needed.    Postoperative Instructions Hysteroscopy D & C  Dr. Phineas Real and the nursing staff have discussed postoperative instructions with you.  If you have any questions please ask them before you leave the hospital, or call Dr Elisabeth Most office at [phone].    We would like to emphasize the following instructions:   ? Call the office to make your follow-up appointment as recommended by Dr Phineas Real (usually 1-2 weeks).  ? You were given a prescription, or one was ordered for you at the pharmacy you designated.  Get that prescription filled and take the medication according to instructions.  ? You may eat a regular diet, but slowly until you start having bowel movements.  ? Drink plenty of water daily.  ? Nothing in the vagina (intercourse, douching, objects of any kind) for two weeks.  When reinitiating intercourse, if it is uncomfortable, stop and make an appointment with Dr Phineas Real to be evaluated.  ? No driving for one to two  days until the effects of anesthesia has worn off.  No traveling out of town for several days.  ? You may shower, but no baths for one week.  Walking up and down stairs is ok.  No heavy lifting, prolonged standing, repeated bending or any working out until your post op check.  ? Rest frequently, listen to your body and do not push yourself and overdo it.  ? Call if:  o Your pain medication does not seem strong enough. o Worsening pain or abdominal bloating o Persistent nausea or vomiting o Difficulty with urination or bowel movements. o Temperature of 101 degrees or higher. o Heavy vaginal bleeding.  If your period is due, you may use tampons. o You have any questions or concerns

## 2017-06-03 NOTE — Anesthesia Procedure Notes (Signed)
Procedure Name: LMA Insertion Date/Time: 06/03/2017 8:56 AM Performed by: Suan Halter, CRNA Pre-anesthesia Checklist: Patient identified, Emergency Drugs available, Suction available and Patient being monitored Patient Re-evaluated:Patient Re-evaluated prior to induction Oxygen Delivery Method: Circle system utilized Preoxygenation: Pre-oxygenation with 100% oxygen Induction Type: IV induction Ventilation: Mask ventilation without difficulty LMA: LMA inserted LMA Size: 4.0 Number of attempts: 1 Airway Equipment and Method: Bite block Placement Confirmation: positive ETCO2 Tube secured with: Tape Dental Injury: Teeth and Oropharynx as per pre-operative assessment

## 2017-06-03 NOTE — Op Note (Signed)
Kimberly Leon [birth year] [id number]   Post Operative Note   Date of surgery:  06/03/2017  Pre Op Dx: Endometrial polyp  Post Op Dx: Endometrial polyp  Procedure: Hysteroscopy, D&C, Myosure resection endometrial polyp  Surgeon:  Anastasio Auerbach  Anesthesia:  General  EBL: 5 cc  Distended media discrepancy: 85 cc saline  Complications:  None  Specimen: #1 endometrial curetting #2 endometrial polyp to pathology  Findings: EUA: External BUS vagina with atrophic changes.  Cervix with atrophic changes.  Uterus normal size midline mobile.  Adnexa without masses   Hysteroscopy: Adequate noting fundus, right/left tubal ostia, anterior/posterior endometrial surfaces, lower uterine segment and endocervical canal all visualized.  Classic fingerlike endometrial polyp left upper endometrial surface resected to the level of the surrounding endometrium.  Left  Procedure: The patient was taken to the operating room, was placed in the low dorsal lithotomy position, underwent general anesthesia, received a perineal/vaginal preparation with Betadine solution per nursing personnel and the bladder was emptied with in and out Foley catheterization. The timeout was performed by the surgical team. An EUA was performed. The patient was draped in the usual fashion. The cervix was visualized with a speculum, anterior lip grasped with a single-tooth tenaculum and a paracervical block was placed using 10 cc's of 1% lidocaine. The cervix was gently dilated to admit the Myosure hysteroscope and hysteroscopy was performed with findings noted above. Using the Myosure Reach resectoscopic wand the polyp was resected in it's entirety to the level the surrounding endometrium. A gentle sharp curettage was performed. Both specimens were sent separately to pathology.  Repeat hysteroscopy showed an empty cavity with good distention and no evidence of perforation. The instruments were removed and adequate hemostasis was  visualized at the tenaculum site and external cervical os.  The specimens were labeled for pathology.  The sponge, needle and instrument count were verified correct.  The patient was given intraoperative Toradol, was awakened without difficulty and was taken to the recovery room in good condition having tolerated the procedure well.     Anastasio Auerbach MD, 9:31 AM 06/03/2017

## 2017-06-03 NOTE — Anesthesia Postprocedure Evaluation (Signed)
Anesthesia Post Note  Patient: Kimberly Leon  Procedure(s) Performed: DILATATION & CURETTAGE/HYSTEROSCOPY WITH MYOSURE RESECTION OF ENDOMETRIAL POLYP (N/A Uterus)     Patient location during evaluation: PACU Anesthesia Type: General Level of consciousness: awake and alert Pain management: pain level controlled Vital Signs Assessment: post-procedure vital signs reviewed and stable Respiratory status: spontaneous breathing, nonlabored ventilation, respiratory function stable and patient connected to nasal cannula oxygen Cardiovascular status: blood pressure returned to baseline and stable Postop Assessment: no apparent nausea or vomiting Anesthetic complications: no    Last Vitals:  Vitals:   06/03/17 1000 06/03/17 1015  BP: (!) 145/76 (!) 148/77  Pulse: 87 84  Resp: 12 12  Temp:    SpO2: 97% 97%    Last Pain:  Vitals:   06/03/17 0635  TempSrc: Oral                 Elisabel Hanover

## 2017-06-03 NOTE — Transfer of Care (Signed)
Immediate Anesthesia Transfer of Care Note  Patient: Kimberly Leon  Procedure(s) Performed: DILATATION & CURETTAGE/HYSTEROSCOPY WITH MYOSURE RESECTION OF ENDOMETRIAL POLYP (N/A Uterus)  Patient Location: PACU  Anesthesia Type:General  Level of Consciousness: awake, alert , oriented and patient cooperative  Airway & Oxygen Therapy: Patient Spontanous Breathing and Patient connected to nasal cannula oxygen  Post-op Assessment: Report given to RN and Post -op Vital signs reviewed and stable  Post vital signs: Reviewed and stable  Last Vitals:  Vitals:   06/03/17 0635  BP: 129/63  Pulse: 69  Resp: 16  Temp: 36.6 C  SpO2: 96%    Last Pain:  Vitals:   06/03/17 0635  TempSrc: Oral      Patients Stated Pain Goal: 6 (18/40/37 5436)  Complications: No apparent anesthesia complications

## 2017-06-03 NOTE — H&P (Signed)
The patient was examined.  I reviewed the proposed surgery and consent form with the patient.  The dictated history and physical is current and accurate and all questions were answered. The patient is ready to proceed with surgery and has a realistic understanding and expectation for the outcome.   Anastasio Auerbach MD, 7:29 AM 06/03/2017

## 2017-06-04 ENCOUNTER — Encounter (HOSPITAL_BASED_OUTPATIENT_CLINIC_OR_DEPARTMENT_OTHER): Payer: Self-pay | Admitting: Gynecology

## 2017-06-06 ENCOUNTER — Telehealth: Payer: Self-pay | Admitting: *Deleted

## 2017-06-06 MED ORDER — ESTRADIOL-LEVONORGESTREL 0.045-0.015 MG/DAY TD PTWK: MEDICATED_PATCH | TRANSDERMAL | 3 refills | Status: DC

## 2017-06-06 NOTE — Telephone Encounter (Signed)
Pharmacy called stating pt needs Rx for climara pro patch sent to [facility] as this is her new pharmacy. Rx sent.

## 2017-06-10 ENCOUNTER — Telehealth: Payer: Self-pay | Admitting: *Deleted

## 2017-06-10 NOTE — Telephone Encounter (Signed)
Pt had D&C on 06/03/17 states she feels good, no pain, no bleeding post op scheduled on 06/17/17, states with your approval she would like to start back playing tennis. Please advise

## 2017-06-10 NOTE — Telephone Encounter (Signed)
Okay 

## 2017-06-10 NOTE — Telephone Encounter (Signed)
Pt informed

## 2017-06-17 ENCOUNTER — Ambulatory Visit (INDEPENDENT_AMBULATORY_CARE_PROVIDER_SITE_OTHER): Payer: PPO | Admitting: Gynecology

## 2017-06-17 ENCOUNTER — Encounter: Payer: Self-pay | Admitting: Gynecology

## 2017-06-17 VITALS — BP 120/74

## 2017-06-17 DIAGNOSIS — Z09 Encounter for follow-up examination after completed treatment for conditions other than malignant neoplasm: Secondary | ICD-10-CM

## 2017-06-17 NOTE — Progress Notes (Signed)
    Kimberly Leon 1945-10-12 [id number]        72 y.o.  [id number] presents for postoperative visit status post hysteroscopic resection endometrial polyp.  She is doing well with no complaints.  No bleeding or pain.  Past medical history,surgical history, problem list, medications, allergies, family history and social history were all reviewed and documented in the EPIC chart.  Directed ROS with pertinent positives and negatives documented in the history of present illness/assessment and plan.  Exam: Caryn Bee assistant Vitals:   06/17/17 1357  BP: 120/74   General appearance:  Normal Abdomen soft nontender without masses guarding rebound Pelvic external BUS vagina with atrophic changes.  Cervix with atrophic changes.  Uterus normal size midline mobile nontender.  Adnexa without masses or tenderness.  Assessment/Plan:  72 y.o. [id number] with normal postoperative visit status post hysteroscopic resection of endometrial polyps.  Reviewed pictures and benign pathology with the patient.  Patient will follow-up end of this year when due for annual exam, sooner if any issues.    Anastasio Auerbach MD, 2:19 PM 06/17/2017

## 2017-06-17 NOTE — Patient Instructions (Signed)
Follow-up end of this year when due for annual exam. 

## 2017-07-01 DIAGNOSIS — L820 Inflamed seborrheic keratosis: Secondary | ICD-10-CM | POA: Diagnosis not present

## 2017-07-01 DIAGNOSIS — L918 Other hypertrophic disorders of the skin: Secondary | ICD-10-CM | POA: Diagnosis not present

## 2017-07-01 DIAGNOSIS — Z1283 Encounter for screening for malignant neoplasm of skin: Secondary | ICD-10-CM | POA: Diagnosis not present

## 2017-07-01 DIAGNOSIS — L219 Seborrheic dermatitis, unspecified: Secondary | ICD-10-CM | POA: Diagnosis not present

## 2017-07-01 DIAGNOSIS — D1801 Hemangioma of skin and subcutaneous tissue: Secondary | ICD-10-CM | POA: Diagnosis not present

## 2017-07-01 DIAGNOSIS — L821 Other seborrheic keratosis: Secondary | ICD-10-CM | POA: Diagnosis not present

## 2017-07-01 DIAGNOSIS — L812 Freckles: Secondary | ICD-10-CM | POA: Diagnosis not present

## 2017-07-01 DIAGNOSIS — L578 Other skin changes due to chronic exposure to nonionizing radiation: Secondary | ICD-10-CM | POA: Diagnosis not present

## 2017-07-15 DIAGNOSIS — R0789 Other chest pain: Secondary | ICD-10-CM | POA: Diagnosis not present

## 2017-07-15 DIAGNOSIS — R0781 Pleurodynia: Secondary | ICD-10-CM | POA: Diagnosis not present

## 2017-10-07 DIAGNOSIS — M189 Osteoarthritis of first carpometacarpal joint, unspecified: Secondary | ICD-10-CM | POA: Insufficient documentation

## 2017-10-07 DIAGNOSIS — M79645 Pain in left finger(s): Secondary | ICD-10-CM | POA: Diagnosis not present

## 2017-10-07 DIAGNOSIS — M1812 Unilateral primary osteoarthritis of first carpometacarpal joint, left hand: Secondary | ICD-10-CM | POA: Diagnosis not present

## 2017-12-10 ENCOUNTER — Other Ambulatory Visit: Payer: Self-pay

## 2017-12-10 MED ORDER — ESTRADIOL-LEVONORGESTREL 0.045-0.015 MG/DAY TD PTWK: MEDICATED_PATCH | TRANSDERMAL | 2 refills | Status: DC

## 2018-01-01 DIAGNOSIS — H16141 Punctate keratitis, right eye: Secondary | ICD-10-CM | POA: Diagnosis not present

## 2018-01-22 DIAGNOSIS — H25813 Combined forms of age-related cataract, bilateral: Secondary | ICD-10-CM | POA: Diagnosis not present

## 2018-02-06 ENCOUNTER — Encounter: Payer: Self-pay | Admitting: Gynecology

## 2018-02-06 DIAGNOSIS — Z1231 Encounter for screening mammogram for malignant neoplasm of breast: Secondary | ICD-10-CM | POA: Diagnosis not present

## 2018-02-06 DIAGNOSIS — Z803 Family history of malignant neoplasm of breast: Secondary | ICD-10-CM | POA: Diagnosis not present

## 2018-02-18 ENCOUNTER — Other Ambulatory Visit: Payer: Self-pay | Admitting: Gynecology

## 2018-02-25 DIAGNOSIS — J0100 Acute maxillary sinusitis, unspecified: Secondary | ICD-10-CM | POA: Diagnosis not present

## 2018-02-27 ENCOUNTER — Encounter: Payer: PPO | Admitting: Gynecology

## 2018-03-10 ENCOUNTER — Other Ambulatory Visit: Payer: Self-pay | Admitting: Gynecology

## 2018-03-12 DIAGNOSIS — D2230 Melanocytic nevi of unspecified part of face: Secondary | ICD-10-CM | POA: Diagnosis not present

## 2018-03-12 DIAGNOSIS — D229 Melanocytic nevi, unspecified: Secondary | ICD-10-CM | POA: Diagnosis not present

## 2018-03-12 DIAGNOSIS — D225 Melanocytic nevi of trunk: Secondary | ICD-10-CM | POA: Diagnosis not present

## 2018-03-12 DIAGNOSIS — L720 Epidermal cyst: Secondary | ICD-10-CM | POA: Diagnosis not present

## 2018-03-12 DIAGNOSIS — L812 Freckles: Secondary | ICD-10-CM | POA: Diagnosis not present

## 2018-03-12 DIAGNOSIS — L820 Inflamed seborrheic keratosis: Secondary | ICD-10-CM | POA: Diagnosis not present

## 2018-03-12 DIAGNOSIS — L918 Other hypertrophic disorders of the skin: Secondary | ICD-10-CM | POA: Diagnosis not present

## 2018-03-12 DIAGNOSIS — L821 Other seborrheic keratosis: Secondary | ICD-10-CM | POA: Diagnosis not present

## 2018-03-12 DIAGNOSIS — D1801 Hemangioma of skin and subcutaneous tissue: Secondary | ICD-10-CM | POA: Diagnosis not present

## 2018-03-12 DIAGNOSIS — Z1283 Encounter for screening for malignant neoplasm of skin: Secondary | ICD-10-CM | POA: Diagnosis not present

## 2018-03-12 DIAGNOSIS — L578 Other skin changes due to chronic exposure to nonionizing radiation: Secondary | ICD-10-CM | POA: Diagnosis not present

## 2018-04-15 ENCOUNTER — Encounter: Payer: PPO | Admitting: Gynecology

## 2018-04-28 DIAGNOSIS — J069 Acute upper respiratory infection, unspecified: Secondary | ICD-10-CM | POA: Diagnosis not present

## 2018-05-07 DIAGNOSIS — M9903 Segmental and somatic dysfunction of lumbar region: Secondary | ICD-10-CM | POA: Diagnosis not present

## 2018-05-07 DIAGNOSIS — M9905 Segmental and somatic dysfunction of pelvic region: Secondary | ICD-10-CM | POA: Diagnosis not present

## 2018-05-07 DIAGNOSIS — M955 Acquired deformity of pelvis: Secondary | ICD-10-CM | POA: Diagnosis not present

## 2018-05-07 DIAGNOSIS — M5114 Intervertebral disc disorders with radiculopathy, thoracic region: Secondary | ICD-10-CM | POA: Diagnosis not present

## 2018-05-12 ENCOUNTER — Other Ambulatory Visit: Payer: Self-pay | Admitting: Gynecology

## 2018-05-12 DIAGNOSIS — M5114 Intervertebral disc disorders with radiculopathy, thoracic region: Secondary | ICD-10-CM | POA: Diagnosis not present

## 2018-05-12 DIAGNOSIS — M9905 Segmental and somatic dysfunction of pelvic region: Secondary | ICD-10-CM | POA: Diagnosis not present

## 2018-05-12 DIAGNOSIS — M9903 Segmental and somatic dysfunction of lumbar region: Secondary | ICD-10-CM | POA: Diagnosis not present

## 2018-05-12 DIAGNOSIS — M955 Acquired deformity of pelvis: Secondary | ICD-10-CM | POA: Diagnosis not present

## 2018-05-20 ENCOUNTER — Ambulatory Visit (INDEPENDENT_AMBULATORY_CARE_PROVIDER_SITE_OTHER): Payer: PPO | Admitting: Gynecology

## 2018-05-20 ENCOUNTER — Encounter: Payer: Self-pay | Admitting: Gynecology

## 2018-05-20 VITALS — BP 118/72 | Ht 62.0 in | Wt 146.0 lb

## 2018-05-20 DIAGNOSIS — Z79890 Hormone replacement therapy (postmenopausal): Secondary | ICD-10-CM

## 2018-05-20 DIAGNOSIS — Z01419 Encounter for gynecological examination (general) (routine) without abnormal findings: Secondary | ICD-10-CM

## 2018-05-20 DIAGNOSIS — N952 Postmenopausal atrophic vaginitis: Secondary | ICD-10-CM

## 2018-05-20 MED ORDER — ESTRADIOL-LEVONORGESTREL 0.045-0.015 MG/DAY TD PTWK: 1.0000 | MEDICATED_PATCH | TRANSDERMAL | 12 refills | Status: DC

## 2018-05-20 NOTE — Progress Notes (Signed)
    Kimberly Leon 1945/10/20 [id number]        72 y.o.  [id number] for breast and pelvic exam.  Without gynecologic complaints.  Past medical history,surgical history, problem list, medications, allergies, family history and social history were all reviewed and documented as reviewed in the EPIC chart.  ROS:  Performed with pertinent positives and negatives included in the history, assessment and plan.   Additional significant findings : None   Exam: Caryn Bee assistant Vitals:   05/20/18 1148  BP: 118/72  Weight: 146 lb (66.2 kg)  Height: 5\' 2"  (1.575 m)   Body mass index is 26.7 kg/m.  General appearance:  Normal affect, orientation and appearance. Skin: Grossly normal HEENT: Without gross lesions.  No cervical or supraclavicular adenopathy. Thyroid normal.  Lungs:  Clear without wheezing, rales or rhonchi Cardiac: RR, without RMG Abdominal:  Soft, nontender, without masses, guarding, rebound, organomegaly or hernia Breasts:  Examined lying and sitting without masses, retractions, discharge or axillary adenopathy. Pelvic:  Ext, BUS, Vagina: With atrophic changes  Cervix: With atrophic changes  Uterus: Anteverted, normal size, shape and contour, midline and mobile nontender   Adnexa: Without masses or tenderness    Anus and perineum: Normal   Rectovaginal: Normal sphincter tone without palpated masses or tenderness.    Assessment/Plan:  73 y.o. [id number] female for breast and pelvic exam  1. Postmenopausal/atrophic genital changes/HRT.  Continues on Climara pro 0.045/0.015.  Desires to continue.  Notes when she does not switch her patch on time she starts to feel bad.  We again discussed the risks of HRT particularly in her 4s to include increased risk of stroke heart attack DVT.  Breast cancer issue was also discussed.  At this point the patient strongly wants to continue and I refilled her x1 year. 2. DEXA 2019 normal.  Plan repeat DEXA at 5-year interval. 3. Mammography  01/2018.  Continue with annual mammography when due.  Breast exam normal today. 4. Pap smear 01/2017.  No Pap smear done today.  No history of abnormal Pap smears.  Options to stop screening per current screening guidelines reviewed.  Will readdress on annual basis. 5. Colonoscopy 2014.  Repeat at their recommended interval. 6. Health maintenance.  No routine lab work done as patient does this elsewhere.  Follow-up 1 year, sooner as needed.   Anastasio Auerbach MD, 12:16 PM 05/20/2018

## 2018-05-20 NOTE — Patient Instructions (Signed)
Follow-up in 1 year for annual exam, sooner if any issues. 

## 2018-08-26 DIAGNOSIS — H1032 Unspecified acute conjunctivitis, left eye: Secondary | ICD-10-CM | POA: Diagnosis not present

## 2018-09-29 DIAGNOSIS — M79672 Pain in left foot: Secondary | ICD-10-CM | POA: Diagnosis not present

## 2018-09-29 DIAGNOSIS — G5762 Lesion of plantar nerve, left lower limb: Secondary | ICD-10-CM | POA: Diagnosis not present

## 2018-09-29 DIAGNOSIS — M7752 Other enthesopathy of left foot: Secondary | ICD-10-CM | POA: Diagnosis not present

## 2018-10-22 DIAGNOSIS — L820 Inflamed seborrheic keratosis: Secondary | ICD-10-CM | POA: Diagnosis not present

## 2018-10-22 DIAGNOSIS — R21 Rash and other nonspecific skin eruption: Secondary | ICD-10-CM | POA: Diagnosis not present

## 2018-12-31 ENCOUNTER — Encounter: Payer: Self-pay | Admitting: Gynecology

## 2019-01-20 DIAGNOSIS — E663 Overweight: Secondary | ICD-10-CM | POA: Diagnosis not present

## 2019-01-22 DIAGNOSIS — H25813 Combined forms of age-related cataract, bilateral: Secondary | ICD-10-CM | POA: Diagnosis not present

## 2019-02-11 DIAGNOSIS — E7800 Pure hypercholesterolemia, unspecified: Secondary | ICD-10-CM | POA: Diagnosis not present

## 2019-02-11 DIAGNOSIS — R739 Hyperglycemia, unspecified: Secondary | ICD-10-CM | POA: Diagnosis not present

## 2019-02-18 DIAGNOSIS — R739 Hyperglycemia, unspecified: Secondary | ICD-10-CM | POA: Diagnosis not present

## 2019-02-18 DIAGNOSIS — Z0000 Encounter for general adult medical examination without abnormal findings: Secondary | ICD-10-CM | POA: Diagnosis not present

## 2019-02-18 DIAGNOSIS — F325 Major depressive disorder, single episode, in full remission: Secondary | ICD-10-CM | POA: Diagnosis not present

## 2019-02-26 DIAGNOSIS — E663 Overweight: Secondary | ICD-10-CM | POA: Diagnosis not present

## 2019-03-03 DIAGNOSIS — Z803 Family history of malignant neoplasm of breast: Secondary | ICD-10-CM | POA: Diagnosis not present

## 2019-03-03 DIAGNOSIS — Z1231 Encounter for screening mammogram for malignant neoplasm of breast: Secondary | ICD-10-CM | POA: Diagnosis not present

## 2019-03-09 ENCOUNTER — Encounter: Payer: Self-pay | Admitting: Gynecology

## 2019-03-11 ENCOUNTER — Other Ambulatory Visit: Payer: Self-pay

## 2019-03-11 ENCOUNTER — Ambulatory Visit: Payer: PPO | Attending: Internal Medicine

## 2019-03-11 DIAGNOSIS — Z20828 Contact with and (suspected) exposure to other viral communicable diseases: Secondary | ICD-10-CM | POA: Diagnosis not present

## 2019-03-11 DIAGNOSIS — Z20822 Contact with and (suspected) exposure to covid-19: Secondary | ICD-10-CM

## 2019-03-13 DIAGNOSIS — R928 Other abnormal and inconclusive findings on diagnostic imaging of breast: Secondary | ICD-10-CM | POA: Diagnosis not present

## 2019-03-14 LAB — NOVEL CORONAVIRUS, NAA: SARS-CoV-2, NAA: NOT DETECTED

## 2019-04-29 DIAGNOSIS — E663 Overweight: Secondary | ICD-10-CM | POA: Diagnosis not present

## 2019-04-30 DIAGNOSIS — C44319 Basal cell carcinoma of skin of other parts of face: Secondary | ICD-10-CM | POA: Diagnosis not present

## 2019-05-10 DIAGNOSIS — D1801 Hemangioma of skin and subcutaneous tissue: Secondary | ICD-10-CM | POA: Diagnosis not present

## 2019-05-10 DIAGNOSIS — L820 Inflamed seborrheic keratosis: Secondary | ICD-10-CM | POA: Diagnosis not present

## 2019-05-10 DIAGNOSIS — L219 Seborrheic dermatitis, unspecified: Secondary | ICD-10-CM | POA: Diagnosis not present

## 2019-05-10 DIAGNOSIS — L821 Other seborrheic keratosis: Secondary | ICD-10-CM | POA: Diagnosis not present

## 2019-05-10 DIAGNOSIS — C44319 Basal cell carcinoma of skin of other parts of face: Secondary | ICD-10-CM | POA: Diagnosis not present

## 2019-05-10 DIAGNOSIS — I781 Nevus, non-neoplastic: Secondary | ICD-10-CM | POA: Diagnosis not present

## 2019-05-10 DIAGNOSIS — C4491 Basal cell carcinoma of skin, unspecified: Secondary | ICD-10-CM

## 2019-05-10 DIAGNOSIS — Z85828 Personal history of other malignant neoplasm of skin: Secondary | ICD-10-CM | POA: Diagnosis not present

## 2019-05-10 HISTORY — DX: Basal cell carcinoma of skin, unspecified: C44.91

## 2019-05-28 ENCOUNTER — Encounter: Payer: PPO | Admitting: Obstetrics and Gynecology

## 2019-06-11 DIAGNOSIS — M25512 Pain in left shoulder: Secondary | ICD-10-CM | POA: Diagnosis not present

## 2019-06-16 DIAGNOSIS — Z713 Dietary counseling and surveillance: Secondary | ICD-10-CM | POA: Diagnosis not present

## 2019-06-29 DIAGNOSIS — M25512 Pain in left shoulder: Secondary | ICD-10-CM | POA: Diagnosis not present

## 2019-07-06 DIAGNOSIS — L814 Other melanin hyperpigmentation: Secondary | ICD-10-CM | POA: Diagnosis not present

## 2019-07-06 DIAGNOSIS — L988 Other specified disorders of the skin and subcutaneous tissue: Secondary | ICD-10-CM | POA: Diagnosis not present

## 2019-07-06 DIAGNOSIS — L578 Other skin changes due to chronic exposure to nonionizing radiation: Secondary | ICD-10-CM | POA: Diagnosis not present

## 2019-07-06 DIAGNOSIS — C44319 Basal cell carcinoma of skin of other parts of face: Secondary | ICD-10-CM | POA: Diagnosis not present

## 2019-07-10 DIAGNOSIS — M25512 Pain in left shoulder: Secondary | ICD-10-CM | POA: Diagnosis not present

## 2019-07-15 DIAGNOSIS — M25512 Pain in left shoulder: Secondary | ICD-10-CM | POA: Diagnosis not present

## 2019-07-15 DIAGNOSIS — M75122 Complete rotator cuff tear or rupture of left shoulder, not specified as traumatic: Secondary | ICD-10-CM | POA: Diagnosis not present

## 2019-07-15 DIAGNOSIS — S46112D Strain of muscle, fascia and tendon of long head of biceps, left arm, subsequent encounter: Secondary | ICD-10-CM | POA: Diagnosis not present

## 2019-07-21 DIAGNOSIS — M9903 Segmental and somatic dysfunction of lumbar region: Secondary | ICD-10-CM | POA: Diagnosis not present

## 2019-07-21 DIAGNOSIS — M955 Acquired deformity of pelvis: Secondary | ICD-10-CM | POA: Diagnosis not present

## 2019-07-21 DIAGNOSIS — M9905 Segmental and somatic dysfunction of pelvic region: Secondary | ICD-10-CM | POA: Diagnosis not present

## 2019-07-21 DIAGNOSIS — M5114 Intervertebral disc disorders with radiculopathy, thoracic region: Secondary | ICD-10-CM | POA: Diagnosis not present

## 2019-07-29 ENCOUNTER — Other Ambulatory Visit: Payer: Self-pay

## 2019-07-29 ENCOUNTER — Ambulatory Visit: Payer: PPO | Admitting: Dermatology

## 2019-07-29 DIAGNOSIS — L821 Other seborrheic keratosis: Secondary | ICD-10-CM

## 2019-07-29 DIAGNOSIS — Z85828 Personal history of other malignant neoplasm of skin: Secondary | ICD-10-CM

## 2019-07-29 DIAGNOSIS — L820 Inflamed seborrheic keratosis: Secondary | ICD-10-CM | POA: Diagnosis not present

## 2019-07-29 DIAGNOSIS — L814 Other melanin hyperpigmentation: Secondary | ICD-10-CM

## 2019-07-29 DIAGNOSIS — Z1283 Encounter for screening for malignant neoplasm of skin: Secondary | ICD-10-CM

## 2019-07-29 DIAGNOSIS — D229 Melanocytic nevi, unspecified: Secondary | ICD-10-CM

## 2019-07-29 DIAGNOSIS — L578 Other skin changes due to chronic exposure to nonionizing radiation: Secondary | ICD-10-CM

## 2019-07-29 DIAGNOSIS — D1801 Hemangioma of skin and subcutaneous tissue: Secondary | ICD-10-CM | POA: Diagnosis not present

## 2019-07-29 NOTE — Progress Notes (Signed)
   Follow-Up Visit   Subjective  Kimberly Leon is a 74 y.o. female who presents for the following: HX BCC (Left midline forehead bx proven 05/10/19, Status post Moh's 07/06/19 with Dr. Manley Mason), ISK follow up (patient states that she can still see the isk), and TBSE (no concerning areas per patient today). Patient presents for total body skin examination for skin cancer screening and mole check.  The following portions of the chart were reviewed this encounter and updated as appropriate:  Tobacco  Allergies  Meds  Problems  Med Hx  Surg Hx  Fam Hx     Review of Systems:  No other skin or systemic complaints except as noted in HPI or Assessment and Plan.  Objective  Well appearing patient in no apparent distress; mood and affect are within normal limits.  A full examination was performed including scalp, head, eyes, ears, nose, lips, neck, chest, axillae, abdomen, back, buttocks, bilateral upper extremities, bilateral lower extremities, hands, feet, fingers, toes, fingernails, and toenails. All findings within normal limits unless otherwise noted below.  Objective  Mid Forehead Hx of BCC Well healed BCC Mohs scar with no evidence of recurrence.   Objective  Right chest, left cheek (2): Erythematous keratotic or waxy stuck-on papule or plaque.    Assessment & Plan  History of basal cell carcinoma (BCC) Mid Forehead  S/P Moh's Left Midline forehead  Clear. Observe for recurrence. Call clinic for new or changing lesions.  Recommend regular skin exams, daily broad-spectrum spf 30+ sunscreen use, and photoprotection.     Inflamed seborrheic keratosis (2) Right chest, left cheek  Destruction of lesion - Right chest, left cheek Complexity: simple   Destruction method: cryotherapy   Informed consent: discussed and consent obtained   Timeout:  patient name, date of birth, surgical site, and procedure verified Lesion destroyed using liquid nitrogen: Yes   Region frozen until  ice ball extended beyond lesion: Yes   Outcome: patient tolerated procedure well with no complications   Post-procedure details: wound care instructions given    Skin cancer screening  Actinic Damage - diffuse scaly erythematous macules with underlying dyspigmentation - Recommend daily broad spectrum sunscreen SPF 30+ to sun-exposed areas, reapply every 2 hours as needed.  - Call for new or changing lesions.  Melanocytic Nevi - Tan-brown and/or pink-flesh-colored symmetric macules and papules - Benign appearing on exam today - Observation - Call clinic for new or changing moles - Recommend daily use of broad spectrum spf 30+ sunscreen to sun-exposed areas.   Lentigines - Scattered tan macules - Discussed due to sun exposure - Benign, observe - Call for any changes  Hemangiomas - Red papules - Discussed benign nature - Observe - Call for any changes  Seborrheic Keratoses - Stuck-on, waxy, tan-brown papules and plaques  - Discussed benign etiology and prognosis. - Observe - Call for any changes  Return in about 1 year (around 07/28/2020) for TBSE.  I, Donzetta Kohut, CMA, am acting as scribe for Sarina Ser, MD  Documentation: I have reviewed the above documentation for accuracy and completeness, and I agree with the above.  Sarina Ser, MD

## 2019-08-01 ENCOUNTER — Encounter: Payer: Self-pay | Admitting: Dermatology

## 2019-08-03 DIAGNOSIS — M9903 Segmental and somatic dysfunction of lumbar region: Secondary | ICD-10-CM | POA: Diagnosis not present

## 2019-08-03 DIAGNOSIS — M955 Acquired deformity of pelvis: Secondary | ICD-10-CM | POA: Diagnosis not present

## 2019-08-03 DIAGNOSIS — M9905 Segmental and somatic dysfunction of pelvic region: Secondary | ICD-10-CM | POA: Diagnosis not present

## 2019-08-03 DIAGNOSIS — M5114 Intervertebral disc disorders with radiculopathy, thoracic region: Secondary | ICD-10-CM | POA: Diagnosis not present

## 2019-08-12 DIAGNOSIS — M9903 Segmental and somatic dysfunction of lumbar region: Secondary | ICD-10-CM | POA: Diagnosis not present

## 2019-08-12 DIAGNOSIS — M9905 Segmental and somatic dysfunction of pelvic region: Secondary | ICD-10-CM | POA: Diagnosis not present

## 2019-08-12 DIAGNOSIS — M955 Acquired deformity of pelvis: Secondary | ICD-10-CM | POA: Diagnosis not present

## 2019-08-12 DIAGNOSIS — M5114 Intervertebral disc disorders with radiculopathy, thoracic region: Secondary | ICD-10-CM | POA: Diagnosis not present

## 2019-12-03 ENCOUNTER — Other Ambulatory Visit: Payer: Self-pay

## 2019-12-03 ENCOUNTER — Ambulatory Visit (INDEPENDENT_AMBULATORY_CARE_PROVIDER_SITE_OTHER): Payer: PPO | Admitting: Obstetrics and Gynecology

## 2019-12-03 ENCOUNTER — Encounter: Payer: Self-pay | Admitting: Obstetrics and Gynecology

## 2019-12-03 VITALS — BP 122/80 | Ht 62.0 in | Wt 143.0 lb

## 2019-12-03 DIAGNOSIS — N952 Postmenopausal atrophic vaginitis: Secondary | ICD-10-CM | POA: Diagnosis not present

## 2019-12-03 DIAGNOSIS — Z79890 Hormone replacement therapy (postmenopausal): Secondary | ICD-10-CM

## 2019-12-03 DIAGNOSIS — Z01419 Encounter for gynecological examination (general) (routine) without abnormal findings: Secondary | ICD-10-CM | POA: Diagnosis not present

## 2019-12-03 DIAGNOSIS — Z124 Encounter for screening for malignant neoplasm of cervix: Secondary | ICD-10-CM

## 2019-12-03 MED ORDER — ESTRADIOL-LEVONORGESTREL 0.045-0.015 MG/DAY TD PTWK: 1.0000 | MEDICATED_PATCH | TRANSDERMAL | 12 refills | Status: DC

## 2019-12-03 NOTE — Progress Notes (Signed)
MOLLI GETHERS Sep 11, 1945 [id number]  SUBJECTIVE:  74 y.o. G2P2002 female here for a breast and pelvic exam and Pap smear. She has no gynecologic concerns.  Slight intermittent hesitation with urinary bladder emptying, no dysuria/pain, frequency, incontinence problems, sometimes requiring her to bend at waist to empty.   Current Outpatient Medications  Medication Sig Dispense Refill  . cetirizine (ZYRTEC) 10 MG tablet Take 10 mg by mouth daily as needed for allergies.    . Cholecalciferol (VITAMIN D3) 1000 units CAPS Take 1 capsule by mouth daily.    . citalopram (CELEXA) 40 MG tablet Take 40 mg by mouth every morning.    Marland Kitchen esomeprazole (NEXIUM) 40 MG capsule Take 40 mg by mouth daily before breakfast.     . estradiol-levonorgestrel (CLIMARAPRO) 0.045-0.015 MG/DAY Place 1 patch onto the skin once a week. 4 patch 12  . etodolac (LODINE) 400 MG tablet Take by mouth.    . fluorometholone (FML) 0.1 % ophthalmic suspension     . fluticasone (FLONASE) 50 MCG/ACT nasal spray Place 2 sprays into the nose daily as needed.     . hydrocortisone 2.5 % lotion APPLY TO AFFECTED AREAS EVERY OTHER DAY AS NEEDED    . ketoconazole (NIZORAL) 2 % shampoo ketoconazole 2 % shampoo    . meloxicam (MOBIC) 15 MG tablet Take 15 mg by mouth daily.      . Misc Natural Products (TURMERIC CURCUMIN) CAPS Take 1 capsule by mouth daily.    . mometasone (ELOCON) 0.1 % lotion mometasone 0.1 % topical solution    . omeprazole (PRILOSEC) 20 MG capsule     . phentermine (ADIPEX-P) 37.5 MG tablet Take by mouth.    . traZODone (DESYREL) 150 MG tablet Take 150 mg by mouth at bedtime.    . vitamin B-12 (CYANOCOBALAMIN) 1000 MCG tablet Take 1,000 mcg by mouth daily.     No current facility-administered medications for this visit.   Allergies: Patient has no known allergies.  No LMP recorded. Patient is postmenopausal.  Past medical history,surgical history, problem list, medications, allergies, family history and social  history were all reviewed and documented as reviewed in the EPIC chart.  GYN ROS: no abnormal bleeding, pelvic pain or discharge, no breast pain or new or enlarging lumps on self exam.  No dysuria, urinary frequency, pain with urination, cloudy/malodorous urine.   OBJECTIVE:  BP 122/80 (BP Location: Right Arm, Patient Position: Sitting, Cuff Size: Normal)   Ht 5\' 2"  (1.575 m)   Wt 143 lb (64.9 kg)   BMI 26.16 kg/m  The patient appears well, alert, oriented, in no distress.  BREAST EXAM: breasts appear normal, no suspicious masses, no skin or nipple changes or axillary nodes  PELVIC EXAM: VULVA: normal appearing vulva with atrophic change, no masses, tenderness or lesions, VAGINA: normal appearing vagina with trophic change, normal color and discharge, no lesions, CERVIX: normal appearing cervix without discharge or lesions, UTERUS: uterus is normal size, shape, consistency and nontender, ADNEXA: normal adnexa in size, nontender and no masses, PAP: Pap smear done today, thin-prep method  Chaperone: Aurora Mask (DNP student) present during the examination and performed the pelvic exam with me in attendance to confirm the exam findings, Montez Hageman also present during the examination  ASSESSMENT:  74 y.o. [id number] here for a breast and pelvic exam  PLAN:   1. Postmenopausal/HRT.  She will continue on Climara Pro 0.045/0.015 as trying to wean or decrease frequency of dosing has led her to have unacceptable side  effects.  She has been aware of the risks of HRT use including thrombotic diseases such as heart attack, stroke, DVT, PE and the breast cancer issue.  She will let us know if her urinary bladder emptying hesitancy gets any worse and we will refer to urology for further work-up.  No appreciable cystocele on examination today.  Refill x1 year provided. 2. Pap smear 01/2017.  No significant history of abnormal Pap smears.  She was given option to discontinue screening based on  current screening guidelines but strongly desire to continue so a Pap smear is collected today. 3. Mammogram 02/2019.  Normal breast exam today.  She is reminded to schedule an annual mammogram this year when due. 4. Colonoscopy 2014.  She will follow up at the interval recommended by her GI specialist.   5. DEXA 2019 was normal.  Next DEXA recommended 2024 at 5-year interval. 6. Health maintenance.  No labs today as she normally has these completed with her primary care doctor.  Return annually or sooner, prn.  Joseph Pierini MD 12/03/19

## 2019-12-03 NOTE — Addendum Note (Signed)
Addended by: Lorine Bears on: 12/03/2019 03:06 PM   Modules accepted: Orders

## 2019-12-07 LAB — PAP IG W/ RFLX HPV ASCU

## 2019-12-23 DIAGNOSIS — M5114 Intervertebral disc disorders with radiculopathy, thoracic region: Secondary | ICD-10-CM | POA: Diagnosis not present

## 2019-12-23 DIAGNOSIS — M9903 Segmental and somatic dysfunction of lumbar region: Secondary | ICD-10-CM | POA: Diagnosis not present

## 2019-12-23 DIAGNOSIS — M955 Acquired deformity of pelvis: Secondary | ICD-10-CM | POA: Diagnosis not present

## 2019-12-23 DIAGNOSIS — M9905 Segmental and somatic dysfunction of pelvic region: Secondary | ICD-10-CM | POA: Diagnosis not present

## 2020-01-04 DIAGNOSIS — E663 Overweight: Secondary | ICD-10-CM | POA: Diagnosis not present

## 2020-02-03 DIAGNOSIS — M955 Acquired deformity of pelvis: Secondary | ICD-10-CM | POA: Diagnosis not present

## 2020-02-03 DIAGNOSIS — M9903 Segmental and somatic dysfunction of lumbar region: Secondary | ICD-10-CM | POA: Diagnosis not present

## 2020-02-03 DIAGNOSIS — M9905 Segmental and somatic dysfunction of pelvic region: Secondary | ICD-10-CM | POA: Diagnosis not present

## 2020-02-03 DIAGNOSIS — M5114 Intervertebral disc disorders with radiculopathy, thoracic region: Secondary | ICD-10-CM | POA: Diagnosis not present

## 2020-02-04 DIAGNOSIS — M1711 Unilateral primary osteoarthritis, right knee: Secondary | ICD-10-CM | POA: Diagnosis not present

## 2020-02-04 DIAGNOSIS — M25561 Pain in right knee: Secondary | ICD-10-CM | POA: Diagnosis not present

## 2020-02-09 DIAGNOSIS — M955 Acquired deformity of pelvis: Secondary | ICD-10-CM | POA: Diagnosis not present

## 2020-02-09 DIAGNOSIS — M5114 Intervertebral disc disorders with radiculopathy, thoracic region: Secondary | ICD-10-CM | POA: Diagnosis not present

## 2020-02-09 DIAGNOSIS — M9905 Segmental and somatic dysfunction of pelvic region: Secondary | ICD-10-CM | POA: Diagnosis not present

## 2020-02-09 DIAGNOSIS — M9903 Segmental and somatic dysfunction of lumbar region: Secondary | ICD-10-CM | POA: Diagnosis not present

## 2020-02-15 DIAGNOSIS — E7800 Pure hypercholesterolemia, unspecified: Secondary | ICD-10-CM | POA: Diagnosis not present

## 2020-02-15 DIAGNOSIS — R739 Hyperglycemia, unspecified: Secondary | ICD-10-CM | POA: Diagnosis not present

## 2020-02-15 DIAGNOSIS — Z0000 Encounter for general adult medical examination without abnormal findings: Secondary | ICD-10-CM | POA: Diagnosis not present

## 2020-02-22 DIAGNOSIS — Z0000 Encounter for general adult medical examination without abnormal findings: Secondary | ICD-10-CM | POA: Diagnosis not present

## 2020-02-22 DIAGNOSIS — F325 Major depressive disorder, single episode, in full remission: Secondary | ICD-10-CM | POA: Diagnosis not present

## 2020-02-22 DIAGNOSIS — Z23 Encounter for immunization: Secondary | ICD-10-CM | POA: Diagnosis not present

## 2020-02-22 DIAGNOSIS — R7303 Prediabetes: Secondary | ICD-10-CM | POA: Diagnosis not present

## 2020-03-14 DIAGNOSIS — Z1231 Encounter for screening mammogram for malignant neoplasm of breast: Secondary | ICD-10-CM | POA: Diagnosis not present

## 2020-03-17 ENCOUNTER — Other Ambulatory Visit: Payer: Self-pay | Admitting: Sports Medicine

## 2020-03-17 DIAGNOSIS — M25561 Pain in right knee: Secondary | ICD-10-CM

## 2020-03-17 DIAGNOSIS — M1711 Unilateral primary osteoarthritis, right knee: Secondary | ICD-10-CM

## 2020-03-17 DIAGNOSIS — R928 Other abnormal and inconclusive findings on diagnostic imaging of breast: Secondary | ICD-10-CM | POA: Diagnosis not present

## 2020-03-17 DIAGNOSIS — R922 Inconclusive mammogram: Secondary | ICD-10-CM | POA: Diagnosis not present

## 2020-03-22 ENCOUNTER — Ambulatory Visit
Admission: RE | Admit: 2020-03-22 | Discharge: 2020-03-22 | Disposition: A | Discharge status: Home / Self Care | Payer: PPO | Source: Ambulatory Visit | Attending: Sports Medicine | Admitting: Sports Medicine

## 2020-03-22 ENCOUNTER — Other Ambulatory Visit: Payer: Self-pay

## 2020-03-22 DIAGNOSIS — M25561 Pain in right knee: Secondary | ICD-10-CM | POA: Insufficient documentation

## 2020-03-22 DIAGNOSIS — M1711 Unilateral primary osteoarthritis, right knee: Secondary | ICD-10-CM | POA: Diagnosis not present

## 2020-04-04 DIAGNOSIS — R921 Mammographic calcification found on diagnostic imaging of breast: Secondary | ICD-10-CM | POA: Diagnosis not present

## 2020-04-04 DIAGNOSIS — N6031 Fibrosclerosis of right breast: Secondary | ICD-10-CM | POA: Diagnosis not present

## 2020-04-06 DIAGNOSIS — S83231D Complex tear of medial meniscus, current injury, right knee, subsequent encounter: Secondary | ICD-10-CM | POA: Diagnosis not present

## 2020-04-21 ENCOUNTER — Other Ambulatory Visit: Payer: Self-pay | Admitting: Orthopedic Surgery

## 2020-04-25 ENCOUNTER — Encounter: Payer: Self-pay | Admitting: Obstetrics and Gynecology

## 2020-04-27 ENCOUNTER — Other Ambulatory Visit: Payer: Self-pay

## 2020-04-27 ENCOUNTER — Encounter
Admission: RE | Admit: 2020-04-27 | Discharge: 2020-04-27 | Disposition: A | Discharge status: Home / Self Care | Payer: PPO | Source: Ambulatory Visit | Attending: Orthopedic Surgery | Admitting: Orthopedic Surgery

## 2020-04-27 DIAGNOSIS — Z01818 Encounter for other preprocedural examination: Secondary | ICD-10-CM | POA: Insufficient documentation

## 2020-04-27 HISTORY — DX: Malignant (primary) neoplasm, unspecified: C80.1

## 2020-04-27 NOTE — Patient Instructions (Addendum)
Your procedure is scheduled on:05-05-20 THURSDAY Report to the Registration Desk on the 1st floor of the [facility]-Then proceed to the 2nd floor Surgery Desk in the [place] To find out your arrival time, please call [phone] between 1PM - 3PM on:05-04-20 WEDNESDAY  REMEMBER: Instructions that are not followed completely may result in serious medical risk, up to and including death; or upon the discretion of your surgeon and anesthesiologist your surgery may need to be rescheduled.  Do not eat food after midnight the night before surgery.  No gum chewing, lozengers or hard candies.  You may however, drink CLEAR liquids up to 2 hours before you are scheduled to arrive for your surgery. Do not drink anything within 2 hours of your scheduled arrival time.  Clear liquids include: - water  - apple juice without pulp - gatorade - black coffee or tea (Do NOT add milk or creamers to the coffee or tea) Do NOT drink anything that is not on this list.  In addition, your doctor has ordered for you to drink the provided  Ensure Pre-Surgery Clear Carbohydrate Drink Drinking this carbohydrate drink up to two hours before surgery helps to reduce insulin resistance and improve patient outcomes. Please complete drinking 2 hours prior to scheduled arrival time.  TAKE THESE MEDICATIONS THE MORNING OF SURGERY WITH A SIP OF WATER: -ZYRTEC (CETIRIZINE) -CELEXA (CITALOPRAM) -PRILOSEC (OMEPRAZOLE)-take one the night before and one on the morning of surgery - helps to prevent nausea after surgery.)  One week prior to surgery: Stop Anti-inflammatories (NSAIDS) such as LODINE (ETODOLAC), Advil, Aleve, Ibuprofen, Motrin, Naproxen, Naprosyn and Aspirin based products such as Excedrin, Goodys Powder, BC Powder-OK TO TAKE TYLENOL IF NEEDED  Stop ANY OVER THE COUNTER supplements until after surgery. (However, you may continue taking Vitamin D and Vitamin B12 up until the day before surgery.)  No Alcohol  for 24 hours before or after surgery.  No Smoking including e-cigarettes for 24 hours prior to surgery.  No chewable tobacco products for at least 6 hours prior to surgery.  No nicotine patches on the day of surgery.  Do not use any "recreational" drugs for at least a week prior to your surgery.  Please be advised that the combination of cocaine and anesthesia may have negative outcomes, up to and including death. If you test positive for cocaine, your surgery will be cancelled.  On the morning of surgery brush your teeth with toothpaste and water, you may rinse your mouth with mouthwash if you wish. Do not swallow any toothpaste or mouthwash.  Do not wear jewelry, make-up, hairpins, clips or nail polish.  Do not wear lotions, powders, or perfumes.   Do not shave body from the neck down 48 hours prior to surgery just in case you cut yourself which could leave a site for infection.  Also, freshly shaved skin may become irritated if using the CHG soap.  Contact lenses, hearing aids and dentures may not be worn into surgery.  Do not bring valuables to the hospital. [facility] is not responsible for any missing/lost belongings or valuables.   Use CHG Soap as directed on instruction sheet   Notify your doctor if there is any change in your medical condition (cold, fever, infection).  Wear comfortable clothing (specific to your surgery type) to the hospital.  Plan for stool softeners for home use; pain medications have a tendency to cause constipation. You can also help prevent constipation by eating foods high in fiber such as  fruits and vegetables and drinking plenty of fluids as your diet allows.  After surgery, you can help prevent lung complications by doing breathing exercises.  Take deep breaths and cough every 1-2 hours. Your doctor may order a device called an Incentive Spirometer to help you take deep breaths. When coughing or sneezing, hold a pillow firmly against your  incision with both hands. This is called "splinting." Doing this helps protect your incision. It also decreases belly discomfort.  If you are being admitted to the hospital overnight, leave your suitcase in the car. After surgery it may be brought to your room.  If you are being discharged the day of surgery, you will not be allowed to drive home. You will need a responsible adult (18 years or older) to drive you home and stay with you that night.   If you are taking public transportation, you will need to have a responsible adult (18 years or older) with you. Please confirm with your physician that it is acceptable to use public transportation.   Please call the [place] [facility] at [phone] if you have any questions about these instructions.  Visitation Policy:  Patients undergoing a surgery or procedure may have one family member or support person with them as long as that person is not COVID-19 positive or experiencing its symptoms.  That person may remain in the waiting area during the procedure.  Inpatient Visitation:    Visiting hours are 7 a.m. to 8 p.m. Patients will be allowed one visitor. The visitor may change daily. The visitor must pass COVID-19 screenings, use hand sanitizer when entering and exiting the patient's room and wear a mask at all times, including in the patient's room. Patients must also wear a mask when staff or their visitor are in the room. Masking is required regardless of vaccination status. Systemwide, no visitors 17 or younger.

## 2020-04-28 ENCOUNTER — Encounter
Admission: RE | Admit: 2020-04-28 | Discharge: 2020-04-28 | Disposition: A | Discharge status: Home / Self Care | Payer: PPO | Source: Ambulatory Visit | Attending: Orthopedic Surgery | Admitting: Orthopedic Surgery

## 2020-04-28 DIAGNOSIS — R54 Age-related physical debility: Secondary | ICD-10-CM | POA: Insufficient documentation

## 2020-04-28 DIAGNOSIS — Z01818 Encounter for other preprocedural examination: Secondary | ICD-10-CM | POA: Diagnosis not present

## 2020-04-28 DIAGNOSIS — Z01810 Encounter for preprocedural cardiovascular examination: Secondary | ICD-10-CM | POA: Insufficient documentation

## 2020-05-03 ENCOUNTER — Other Ambulatory Visit: Payer: Self-pay

## 2020-05-03 ENCOUNTER — Other Ambulatory Visit
Admission: RE | Admit: 2020-05-03 | Discharge: 2020-05-03 | Disposition: A | Discharge status: Home / Self Care | Payer: PPO | Source: Ambulatory Visit | Attending: Orthopedic Surgery | Admitting: Orthopedic Surgery

## 2020-05-03 DIAGNOSIS — Z20822 Contact with and (suspected) exposure to covid-19: Secondary | ICD-10-CM | POA: Insufficient documentation

## 2020-05-03 DIAGNOSIS — Z01812 Encounter for preprocedural laboratory examination: Secondary | ICD-10-CM | POA: Insufficient documentation

## 2020-05-04 LAB — SARS CORONAVIRUS 2 (TAT 6-24 HRS): SARS Coronavirus 2: NEGATIVE

## 2020-05-05 ENCOUNTER — Ambulatory Visit: Payer: PPO | Admitting: Anesthesiology

## 2020-05-05 ENCOUNTER — Encounter
Admission: RE | Disposition: A | Discharge status: Home / Self Care | Payer: Self-pay | Source: Home / Self Care | Attending: Orthopedic Surgery

## 2020-05-05 ENCOUNTER — Ambulatory Visit
Admission: RE | Admit: 2020-05-05 | Discharge: 2020-05-05 | Disposition: A | Discharge status: Home / Self Care | Payer: PPO | Attending: Orthopedic Surgery | Admitting: Orthopedic Surgery

## 2020-05-05 ENCOUNTER — Other Ambulatory Visit: Payer: Self-pay

## 2020-05-05 ENCOUNTER — Encounter: Payer: Self-pay | Admitting: Orthopedic Surgery

## 2020-05-05 DIAGNOSIS — Z79899 Other long term (current) drug therapy: Secondary | ICD-10-CM | POA: Diagnosis not present

## 2020-05-05 DIAGNOSIS — Z7951 Long term (current) use of inhaled steroids: Secondary | ICD-10-CM | POA: Diagnosis not present

## 2020-05-05 DIAGNOSIS — Z823 Family history of stroke: Secondary | ICD-10-CM | POA: Diagnosis not present

## 2020-05-05 DIAGNOSIS — S83231A Complex tear of medial meniscus, current injury, right knee, initial encounter: Secondary | ICD-10-CM | POA: Diagnosis not present

## 2020-05-05 DIAGNOSIS — Z8249 Family history of ischemic heart disease and other diseases of the circulatory system: Secondary | ICD-10-CM | POA: Insufficient documentation

## 2020-05-05 DIAGNOSIS — M5431 Sciatica, right side: Secondary | ICD-10-CM | POA: Diagnosis not present

## 2020-05-05 DIAGNOSIS — M6751 Plica syndrome, right knee: Secondary | ICD-10-CM | POA: Insufficient documentation

## 2020-05-05 DIAGNOSIS — M65861 Other synovitis and tenosynovitis, right lower leg: Secondary | ICD-10-CM | POA: Diagnosis not present

## 2020-05-05 DIAGNOSIS — Y939 Activity, unspecified: Secondary | ICD-10-CM | POA: Diagnosis not present

## 2020-05-05 DIAGNOSIS — X58XXXA Exposure to other specified factors, initial encounter: Secondary | ICD-10-CM | POA: Insufficient documentation

## 2020-05-05 DIAGNOSIS — Z8042 Family history of malignant neoplasm of prostate: Secondary | ICD-10-CM | POA: Insufficient documentation

## 2020-05-05 DIAGNOSIS — M25551 Pain in right hip: Secondary | ICD-10-CM | POA: Diagnosis not present

## 2020-05-05 HISTORY — PX: KNEE ARTHROSCOPY: SHX127

## 2020-05-05 SURGERY — ARTHROSCOPY, KNEE
Anesthesia: General | Site: Knee | Laterality: Right

## 2020-05-05 MED ORDER — ONDANSETRON HCL 4 MG/2ML IJ SOLN: 4.0000 mg | Freq: Once | INTRAMUSCULAR | Status: DC | PRN

## 2020-05-05 MED ORDER — DEXAMETHASONE SODIUM PHOSPHATE 10 MG/ML IJ SOLN
INTRAMUSCULAR | Status: AC
  Filled 2020-05-05: qty 1

## 2020-05-05 MED ORDER — PROPOFOL 10 MG/ML IV BOLUS
INTRAVENOUS | Status: AC
  Filled 2020-05-05: qty 40

## 2020-05-05 MED ORDER — METOCLOPRAMIDE HCL 10 MG PO TABS: 5.0000 mg | ORAL_TABLET | Freq: Three times a day (TID) | ORAL | Status: DC | PRN

## 2020-05-05 MED ORDER — FENTANYL CITRATE (PF) 100 MCG/2ML IJ SOLN
INTRAMUSCULAR | Status: DC | PRN
  Administered 2020-05-05 (×4): 25 ug via INTRAVENOUS

## 2020-05-05 MED ORDER — LIDOCAINE HCL (PF) 2 % IJ SOLN
INTRAMUSCULAR | Status: AC
  Filled 2020-05-05: qty 5

## 2020-05-05 MED ORDER — SODIUM CHLORIDE 0.9 % IV SOLN: INTRAVENOUS | Status: DC

## 2020-05-05 MED ORDER — FENTANYL CITRATE (PF) 100 MCG/2ML IJ SOLN
INTRAMUSCULAR | Status: AC
  Administered 2020-05-05: 25 ug via INTRAVENOUS
  Filled 2020-05-05: qty 2

## 2020-05-05 MED ORDER — PHENYLEPHRINE HCL (PRESSORS) 10 MG/ML IV SOLN
INTRAVENOUS | Status: AC
  Filled 2020-05-05: qty 1

## 2020-05-05 MED ORDER — CEFAZOLIN SODIUM-DEXTROSE 2-4 GM/100ML-% IV SOLN
2.0000 g | INTRAVENOUS | Status: AC
  Administered 2020-05-05: 2 g via INTRAVENOUS

## 2020-05-05 MED ORDER — CHLORHEXIDINE GLUCONATE 0.12 % MT SOLN
OROMUCOSAL | Status: AC
  Filled 2020-05-05: qty 15

## 2020-05-05 MED ORDER — LACTATED RINGERS IV SOLN: INTRAVENOUS | Status: DC

## 2020-05-05 MED ORDER — PROPOFOL 10 MG/ML IV BOLUS
INTRAVENOUS | Status: DC | PRN
  Administered 2020-05-05: 120 mg via INTRAVENOUS

## 2020-05-05 MED ORDER — HYDROCODONE-ACETAMINOPHEN 5-325 MG PO TABS: 1.0000 | ORAL_TABLET | Freq: Four times a day (QID) | ORAL | 0 refills | Status: DC | PRN

## 2020-05-05 MED ORDER — ORAL CARE MOUTH RINSE: 15.0000 mL | Freq: Once | OROMUCOSAL | Status: AC

## 2020-05-05 MED ORDER — ONDANSETRON HCL 4 MG/2ML IJ SOLN
INTRAMUSCULAR | Status: AC
  Filled 2020-05-05: qty 2

## 2020-05-05 MED ORDER — METOCLOPRAMIDE HCL 5 MG/ML IJ SOLN: 5.0000 mg | Freq: Three times a day (TID) | INTRAMUSCULAR | Status: DC | PRN

## 2020-05-05 MED ORDER — BUPIVACAINE-EPINEPHRINE (PF) 0.5% -1:200000 IJ SOLN
INTRAMUSCULAR | Status: DC | PRN
  Administered 2020-05-05: 30 mL

## 2020-05-05 MED ORDER — ONDANSETRON HCL 4 MG/2ML IJ SOLN: 4.0000 mg | Freq: Four times a day (QID) | INTRAMUSCULAR | Status: DC | PRN

## 2020-05-05 MED ORDER — ONDANSETRON HCL 4 MG/2ML IJ SOLN
INTRAMUSCULAR | Status: DC | PRN
  Administered 2020-05-05: 4 mg via INTRAVENOUS

## 2020-05-05 MED ORDER — CEFAZOLIN SODIUM-DEXTROSE 2-4 GM/100ML-% IV SOLN
INTRAVENOUS | Status: AC
  Filled 2020-05-05: qty 100

## 2020-05-05 MED ORDER — LIDOCAINE HCL (CARDIAC) PF 100 MG/5ML IV SOSY
PREFILLED_SYRINGE | INTRAVENOUS | Status: DC | PRN
  Administered 2020-05-05: 60 mg via INTRAVENOUS

## 2020-05-05 MED ORDER — FENTANYL CITRATE (PF) 100 MCG/2ML IJ SOLN
INTRAMUSCULAR | Status: AC
  Filled 2020-05-05: qty 2

## 2020-05-05 MED ORDER — FENTANYL CITRATE (PF) 100 MCG/2ML IJ SOLN
25.0000 ug | INTRAMUSCULAR | Status: DC | PRN
  Administered 2020-05-05 (×4): 25 ug via INTRAVENOUS

## 2020-05-05 MED ORDER — CHLORHEXIDINE GLUCONATE 0.12 % MT SOLN
15.0000 mL | Freq: Once | OROMUCOSAL | Status: AC
  Administered 2020-05-05: 15 mL via OROMUCOSAL

## 2020-05-05 MED ORDER — ONDANSETRON HCL 4 MG PO TABS: 4.0000 mg | ORAL_TABLET | Freq: Four times a day (QID) | ORAL | Status: DC | PRN

## 2020-05-05 SURGICAL SUPPLY — 31 items
APL PRP STRL LF DISP 70% ISPRP (MISCELLANEOUS) ×1
BLADE INCISOR PLUS 4.5 (BLADE) IMPLANT
BNDG ELASTIC 4X5.8 VLCR STR LF (GAUZE/BANDAGES/DRESSINGS) IMPLANT
CHLORAPREP W/TINT 26 (MISCELLANEOUS) ×2 IMPLANT
COVER WAND RF STERILE (DRAPES) ×2 IMPLANT
CUFF TOURN SGL QUICK 24 (TOURNIQUET CUFF)
CUFF TOURN SGL QUICK 30 (TOURNIQUET CUFF)
CUFF TRNQT CYL 24X4X16.5-23 (TOURNIQUET CUFF) IMPLANT
CUFF TRNQT CYL 30X4X21-28X (TOURNIQUET CUFF) IMPLANT
DRAPE C-ARMOR (DRAPES) IMPLANT
GAUZE SPONGE 4X4 12PLY STRL (GAUZE/BANDAGES/DRESSINGS) ×2 IMPLANT
GLOVE SURG SYN 9.0  PF PI (GLOVE) ×2
GLOVE SURG SYN 9.0 PF PI (GLOVE) ×1 IMPLANT
GOWN SRG 2XL LVL 4 RGLN SLV (GOWNS) ×1 IMPLANT
GOWN STRL NON-REIN 2XL LVL4 (GOWNS) ×2
GOWN STRL REUS W/ TWL LRG LVL3 (GOWN DISPOSABLE) ×2 IMPLANT
GOWN STRL REUS W/TWL LRG LVL3 (GOWN DISPOSABLE) ×4
IV LACTATED RINGER IRRG 3000ML (IV SOLUTION) ×4
IV LR IRRIG 3000ML ARTHROMATIC (IV SOLUTION) ×2 IMPLANT
KIT TURNOVER KIT A (KITS) ×2 IMPLANT
MANIFOLD NEPTUNE II (INSTRUMENTS) ×4 IMPLANT
NEEDLE HYPO 22GX1.5 SAFETY (NEEDLE) ×2 IMPLANT
PACK ARTHROSCOPY KNEE (MISCELLANEOUS) ×2 IMPLANT
SCALPEL PROTECTED #11 DISP (BLADE) ×2 IMPLANT
SET TUBE SUCT SHAVER OUTFL 24K (TUBING) ×2 IMPLANT
SET TUBE TIP INTRA-ARTICULAR (MISCELLANEOUS) ×2 IMPLANT
SUT ETHILON 4-0 (SUTURE) ×2
SUT ETHILON 4-0 FS2 18XMFL BLK (SUTURE) ×1
SUTURE ETHLN 4-0 FS2 18XMF BLK (SUTURE) ×1 IMPLANT
TUBING ARTHRO INFLOW-ONLY STRL (TUBING) ×2 IMPLANT
WAND COBLATION FLOW 50 (SURGICAL WAND) ×2 IMPLANT

## 2020-05-05 NOTE — Anesthesia Procedure Notes (Signed)
Procedure Name: LMA Insertion Date/Time: 05/05/2020 7:24 AM Performed by: Chanetta Marshall, CRNA Pre-anesthesia Checklist: Patient identified, Emergency Drugs available, Suction available and Patient being monitored Patient Re-evaluated:Patient Re-evaluated prior to induction Oxygen Delivery Method: Circle system utilized Preoxygenation: Pre-oxygenation with 100% oxygen Induction Type: IV induction Ventilation: Mask ventilation without difficulty LMA: LMA inserted LMA Size: 3.0 Tube type: Oral Number of attempts: 1 Placement Confirmation: positive ETCO2,  breath sounds checked- equal and bilateral and CO2 detector Tube secured with: Tape Dental Injury: Teeth and Oropharynx as per pre-operative assessment

## 2020-05-05 NOTE — H&P (Signed)
History of the Present Illness: Kimberly Leon is a 75 y.o. female here today.   The patient presents for evaluation of her left knee. The patient has been seeing Dr. Rosalia Hammers and has had corticosteroid injections. She subsequently had an MRI that showed a complex tear of the posterior horn of the meniscus, with minimal degenerative changes on her x-rays.  The patient reports that she has pain on the medial aspect of her left knee. She notes swelling to the knee. She has difficulty sleeping on her left side due to pain.   I have reviewed past medical, surgical, social and family history, and allergies as documented in the EMR.  Past Medical History: Past Medical History:  Diagnosis Date  . Allergic rhinitis  . Depression  . GERD (gastroesophageal reflux disease)  . Menopausal state  . Osteoarthritis   Past Surgical History: Past Surgical History:  Procedure Laterality Date  . Bilateral foot surgery for hallux rigidus  . COLONOSCOPY 01/20/2003, 02/17/2013  . EGD 01/20/2003, 02/17/2013  . KNEE ARTHROSCOPY Left 10/22/2006  Menisectomy - Medial, femur chondroplasty  . Right rotator cuff tear   Past Family History: Family History  Problem Relation Age of Onset  . Stroke Mother  . Asthma Mother  . Myocardial Infarction (Heart attack) Father  . Prostate cancer Maternal Grandfather   Medications: Current Outpatient Medications Ordered in Epic  Medication Sig Dispense Refill  . cetirizine (ZYRTEC) 10 MG tablet Take 1 tablet by mouth once daily  . cholecalciferol (VITAMIN D3) 2,000 unit tablet Take 2,000 Units by mouth once daily.  . citalopram (CELEXA) 40 MG tablet Take 1 tablet (40 mg total) by mouth once daily 90 tablet 1  . CLIMARA PRO 0.045-0.015 mg/24 hr patch APPLY 1 PATCH ONTO THE SKIN ONCE WEEKLY 4 patch 11  . clindamycin (CLEOCIN T) 1 % lotion clindamycin 1 % lotion  . cyanocobalamin (VITAMIN B12) 1000 MCG tablet Take 1 tablet by mouth once daily  . etodolac  (LODINE) 400 MG tablet Take 1 tablet (400 mg total) by mouth 2 (two) times daily 180 tablet 1  . fluorometholone (FML) 0.1 % ophthalmic suspension  . meloxicam (MOBIC) 15 MG tablet Take 1 tablet (15 mg total) by mouth once daily for 30 days 30 tablet 0  . mometasone (ELOCON) 0.1 % cream mometasone 0.1 % topical cream  . neomycin-polymyxin-dexAMETHasone (MAXITROL) ophthalmic suspension neomycin-polymyxin-dexameth 3.5 mg/mL-10,000 unit/mL-0.1% eye drops  . omeprazole (PRILOSEC) 20 MG DR capsule Take 1 capsule (20 mg total) by mouth once daily 90 capsule 1  . phentermine (ADIPEX-P) 37.5 MG capsule Take 1 capsule (37.5 mg total) by mouth every morning before breakfast 30 capsule 0  . traZODone (DESYREL) 150 MG tablet Take 1 tablet by mouth nightly 90 tablet 2   No current Epic-ordered facility-administered medications on file.   Allergies: No Known Allergies   There is no height or weight on file to calculate BMI.  Review of Systems: A comprehensive 14 point ROS was performed, reviewed, and the pertinent orthopaedic findings are documented in the HPI.  There were no vitals filed for this visit.   General Physical Examination:   General/Constitutional: No apparent distress: well-nourished and well developed. Eyes: Pupils equal, round with synchronous movement. Lungs: Clear to auscultation HEENT: Normal Vascular: No edema, swelling or tenderness, except as noted in detailed exam. Cardiac: Heart rate and rhythm is regular. Integumentary: No impressive skin lesions present, except as noted in detailed exam. Neuro/Psych: Normal mood and affect, oriented to person, place  and time.  Musculoskeletal Examination:  On exam, the patient is tender to palpation over the medial aspect of the right knee. Right knee edema. She has good motion of the right hip.  Radiographs:  Her recent MRI was reviewed with her today as noted above.  Assessment: No diagnosis found.  Plan:  The patient has  clinical findings of left knee complex meniscal tear with pain.   We discussed her condition and treatment options at length today. We reviewed her prior MRI findings. We will discuss arthroscopy further for partial medial meniscectomy. She was shown an AAOS video on arthroscopy today.  Surgical Risks:  The nature of the condition and the proposed procedure has been reviewed in detail with the patient. Surgical versus non-surgical options and prognosis for recovery have been reviewed and the inherent risks and benefits of each have been discussed including the risks of infection, bleeding, injury to nerves / blood vessels / tendons, incomplete relief of symptoms, persisting pain and / or stiffness, loss of function, complex regional pain syndrome, failure of procedure, as appropriate.  Attestation: I, Dawn Royse, am documenting for [employer], MD utilizing [place].     Electronically signed by Lauris Poag, MD at 04/06/2020 9:20 PM EST   Reviewed  H+P. No changes noted.

## 2020-05-05 NOTE — Transfer of Care (Signed)
Immediate Anesthesia Transfer of Care Note  Patient: Kimberly Leon  Procedure(s) Performed: ARTHROSCOPY KNEE (Right Knee)  Patient Location: PACU  Anesthesia Type:General  Level of Consciousness: awake, alert  and oriented  Airway & Oxygen Therapy: Patient Spontanous Breathing and Patient connected to face mask oxygen  Post-op Assessment: Report given to RN and Post -op Vital signs reviewed and stable  Post vital signs: Reviewed and stable  Last Vitals:  Vitals Value Taken Time  BP    Temp    Pulse    Resp    SpO2      Last Pain:  Vitals:   05/05/20 0622  TempSrc: Oral  PainSc: 0-No pain         Complications: No complications documented.

## 2020-05-05 NOTE — Op Note (Signed)
05/05/2020  8:15 AM  PATIENT:  Kimberly Leon  75 y.o. female  PRE-OPERATIVE DIAGNOSIS:  Complex tear of medial meniscus of right knee as current injury, subsequent encounter S83.231D  POST-OPERATIVE DIAGNOSIS: Medial meniscus tear right knee with extensive plica band  PROCEDURE:  Procedure(s): ARTHROSCOPY KNEE (Right) partial medial meniscectomy partial synovectomy of plica  SURGEON: Laurene Footman, MD  ASSISTANTS: None  ANESTHESIA:   general  EBL:  Total I/O In: 1000 [I.V.:900; IV Piggyback:100] Out: 2 [Blood:2]  BLOOD ADMINISTERED:none  DRAINS: none   LOCAL MEDICATIONS USED:  MARCAINE     SPECIMEN:  No Specimen  DISPOSITION OF SPECIMEN:  N/A  COUNTS:  YES  TOURNIQUET:  * Missing tourniquet times found for documented tourniquets in log: [id number] *  IMPLANTS: None  DICTATION: .Dragon Dictation patient was brought to the operating room and after adequate general anesthesia was obtained the right leg was placed in the arthroscopic leg holder with tourniquet applied but not required during the procedure.  After prepping and draping in the usual sterile fashion appropriate patient identification and timeout procedures were completed.  An inferior lateral portal was made and initial inspection revealed marked patellofemoral arthritis especially in the femoral trochlea with some exposed bone.  There was an area of synovitis around this more proximally in the extended along the medial border and had extensive plica band.  Coming on medially inferior medial portal was made and on probing there is a complex tear consistent with MRI findings involving the posterior half of the meniscus with predominantly horizontal tear.  A shaver was used at this point to debride this back and then a wand used to get back to a stable margin resecting most of the posterior horn and a large portion of the middle third.  The articular cartilage showed fibrillation and some areas on the femoral condyle of  significant cartilage wear tibia was much less involved.  The ACL was intact.  Going the lateral compartment the lateral compartment is nearly normal with just some fibrillation of articular cartilage meniscus was intact gutters were free of any loose bodies at this point the plica band was debrided with the use of the shaver and the ArthroCare wand but ablating this showed no longer impinged on the patellofemoral joint.  After this the knee was thoroughly irrigated knowledge mentation withdrawn.  The wounds were closed using simple interrupted 4-0 nylon skin suture followed by infiltration of 10 cc of half percent Sensorcaine with epinephrine into each portal followed by Xeroform 4 x 4 web roll and Ace wrap.  PLAN OF CARE: Discharge to home after PACU  PATIENT DISPOSITION:  PACU - hemodynamically stable.

## 2020-05-05 NOTE — Discharge Instructions (Addendum)
Tried to take it easy through the weekend with just to walk you needed to take care of her self. Ice to the front of the knee today and tomorrow may help with pain and swelling. Leave dressing on until recheck in the office. Pain medicine as directed. Aspirin 325 mg daily until you are walking normally. Okay to resume all other medicines   AMBULATORY SURGERY  DISCHARGE INSTRUCTIONS   1) The drugs that you were given will stay in your system until tomorrow so for the next 24 hours you should not:  A) Drive an automobile B) Make any legal decisions C) Drink any alcoholic beverage   2) You may resume regular meals tomorrow.  Today it is better to start with liquids and gradually work up to solid foods.  You may eat anything you prefer, but it is better to start with liquids, then soup and crackers, and gradually work up to solid foods.   3) Please notify your doctor immediately if you have any unusual bleeding, trouble breathing, redness and pain at the surgery site, drainage, fever, or pain not relieved by medication.    4) Additional Instructions:        Please contact your physician with any problems or Same Day Surgery at [phone], Monday through Friday 6 am to 4 pm, or [place] at [facility] number at [phone].

## 2020-05-05 NOTE — Anesthesia Preprocedure Evaluation (Signed)
Anesthesia Evaluation  Patient identified by MRN, date of birth, ID band Patient awake    Reviewed: Allergy & Precautions, NPO status , Patient's Chart, lab work & pertinent test results  History of Anesthesia Complications Negative for: history of anesthetic complications  Airway Mallampati: II       Dental   Pulmonary neg sleep apnea, neg COPD, Not current smoker,           Cardiovascular (-) hypertension(-) Past MI and (-) CHF (-) dysrhythmias (-) Valvular Problems/Murmurs     Neuro/Psych neg Seizures    GI/Hepatic Neg liver ROS, GERD  Medicated and Controlled,  Endo/Other  neg diabetes  Renal/GU negative Renal ROS     Musculoskeletal   Abdominal   Peds  Hematology   Anesthesia Other Findings   Reproductive/Obstetrics                             Anesthesia Physical Anesthesia Plan  ASA: II  Anesthesia Plan: General   Post-op Pain Management:    Induction: Intravenous  PONV Risk Score and Plan: 3 and Ondansetron and Dexamethasone  Airway Management Planned: LMA  Additional Equipment:   Intra-op Plan:   Post-operative Plan:   Informed Consent: I have reviewed the patients History and Physical, chart, labs and discussed the procedure including the risks, benefits and alternatives for the proposed anesthesia with the patient or authorized representative who has indicated his/her understanding and acceptance.       Plan Discussed with:   Anesthesia Plan Comments:         Anesthesia Quick Evaluation

## 2020-05-05 NOTE — Anesthesia Postprocedure Evaluation (Signed)
Anesthesia Post Note  Patient: Kimberly Leon  Procedure(s) Performed: ARTHROSCOPY KNEE (Right Knee)  Patient location during evaluation: PACU Anesthesia Type: General Level of consciousness: awake and alert Pain management: pain level controlled Vital Signs Assessment: post-procedure vital signs reviewed and stable Respiratory status: spontaneous breathing and respiratory function stable Cardiovascular status: stable Anesthetic complications: no   No complications documented.   Last Vitals:  Vitals:   05/05/20 0909 05/05/20 0920  BP: (!) 156/73 (!) 163/79  Pulse: 66 74  Resp: 10 18  Temp: 36.6 C 36.6 C  SpO2: 94% 94%    Last Pain:  Vitals:   05/05/20 0920  TempSrc: Tympanic  PainSc: 3                  Dalphine Cowie K

## 2020-05-11 DIAGNOSIS — M25561 Pain in right knee: Secondary | ICD-10-CM | POA: Diagnosis not present

## 2020-05-13 DIAGNOSIS — M25561 Pain in right knee: Secondary | ICD-10-CM | POA: Diagnosis not present

## 2020-05-17 DIAGNOSIS — M25561 Pain in right knee: Secondary | ICD-10-CM | POA: Diagnosis not present

## 2020-05-20 DIAGNOSIS — M25561 Pain in right knee: Secondary | ICD-10-CM | POA: Diagnosis not present

## 2020-05-24 DIAGNOSIS — M25561 Pain in right knee: Secondary | ICD-10-CM | POA: Diagnosis not present

## 2020-05-27 DIAGNOSIS — M25561 Pain in right knee: Secondary | ICD-10-CM | POA: Diagnosis not present

## 2020-05-31 DIAGNOSIS — M25561 Pain in right knee: Secondary | ICD-10-CM | POA: Diagnosis not present

## 2020-06-01 ENCOUNTER — Other Ambulatory Visit: Payer: Self-pay | Admitting: Dermatology

## 2020-06-03 DIAGNOSIS — M25561 Pain in right knee: Secondary | ICD-10-CM | POA: Diagnosis not present

## 2020-06-06 DIAGNOSIS — M25561 Pain in right knee: Secondary | ICD-10-CM | POA: Diagnosis not present

## 2020-06-08 ENCOUNTER — Other Ambulatory Visit: Payer: Self-pay | Admitting: Dermatology

## 2020-06-13 DIAGNOSIS — M25561 Pain in right knee: Secondary | ICD-10-CM | POA: Diagnosis not present

## 2020-06-16 DIAGNOSIS — M25561 Pain in right knee: Secondary | ICD-10-CM | POA: Diagnosis not present

## 2020-06-20 DIAGNOSIS — M25561 Pain in right knee: Secondary | ICD-10-CM | POA: Diagnosis not present

## 2020-06-23 DIAGNOSIS — M25561 Pain in right knee: Secondary | ICD-10-CM | POA: Diagnosis not present

## 2020-07-12 DIAGNOSIS — H1045 Other chronic allergic conjunctivitis: Secondary | ICD-10-CM | POA: Diagnosis not present

## 2020-07-28 ENCOUNTER — Encounter: Payer: Self-pay | Admitting: Dermatology

## 2020-07-28 ENCOUNTER — Other Ambulatory Visit: Payer: Self-pay

## 2020-07-28 ENCOUNTER — Ambulatory Visit: Payer: PPO | Admitting: Dermatology

## 2020-07-28 DIAGNOSIS — L820 Inflamed seborrheic keratosis: Secondary | ICD-10-CM | POA: Diagnosis not present

## 2020-07-28 DIAGNOSIS — D229 Melanocytic nevi, unspecified: Secondary | ICD-10-CM | POA: Diagnosis not present

## 2020-07-28 DIAGNOSIS — Z85828 Personal history of other malignant neoplasm of skin: Secondary | ICD-10-CM | POA: Diagnosis not present

## 2020-07-28 DIAGNOSIS — L219 Seborrheic dermatitis, unspecified: Secondary | ICD-10-CM | POA: Diagnosis not present

## 2020-07-28 DIAGNOSIS — Z1283 Encounter for screening for malignant neoplasm of skin: Secondary | ICD-10-CM | POA: Diagnosis not present

## 2020-07-28 DIAGNOSIS — D1800 Hemangioma unspecified site: Secondary | ICD-10-CM | POA: Diagnosis not present

## 2020-07-28 DIAGNOSIS — L578 Other skin changes due to chronic exposure to nonionizing radiation: Secondary | ICD-10-CM

## 2020-07-28 DIAGNOSIS — L821 Other seborrheic keratosis: Secondary | ICD-10-CM

## 2020-07-28 DIAGNOSIS — L814 Other melanin hyperpigmentation: Secondary | ICD-10-CM

## 2020-07-28 MED ORDER — KETOCONAZOLE 2 % EX SHAM
MEDICATED_SHAMPOO | CUTANEOUS | 6 refills | Status: DC
Start: 2020-07-28 — End: 2021-10-10

## 2020-07-28 NOTE — Patient Instructions (Addendum)

## 2020-07-28 NOTE — Progress Notes (Signed)
Follow-Up Visit   Subjective  Kimberly Leon is a 75 y.o. female who presents for the following: Annual Exam (Mole check ). Hx of BCC on the forehead. Pt request refill of shampoo and oil drops for dermatitis on the scalp and ears.  She complains of irritating lesions on the back she would like treated. The patient presents for Total-Body Skin Exam (TBSE) for skin cancer screening and mole check.  The following portions of the chart were reviewed this encounter and updated as appropriate:   Tobacco  Allergies  Meds  Problems  Med Hx  Surg Hx  Fam Hx     Review of Systems:  No other skin or systemic complaints except as noted in HPI or Assessment and Plan.  Objective  Well appearing patient in no apparent distress; mood and affect are within normal limits.  A full examination was performed including scalp, head, eyes, ears, nose, lips, neck, chest, axillae, abdomen, back, buttocks, bilateral upper extremities, bilateral lower extremities, hands, feet, fingers, toes, fingernails, and toenails. All findings within normal limits unless otherwise noted below.  Objective  upper back x 17 (17): Erythematous keratotic or waxy stuck-on papule or plaque.   Objective  face,ears,scalp: Erythematous keratotic or waxy stuck-on papule or plaque.    Assessment & Plan  Inflamed seborrheic keratosis (17) upper back x 17  Destruction of lesion - upper back x 17 Complexity: simple   Destruction method: cryotherapy   Informed consent: discussed and consent obtained   Timeout:  patient name, date of birth, surgical site, and procedure verified Lesion destroyed using liquid nitrogen: Yes   Region frozen until ice ball extended beyond lesion: Yes   Outcome: patient tolerated procedure well with no complications   Post-procedure details: wound care instructions given    Seborrheic dermatitis face,ears,scalp Ketoconazole shampoo 2 to 3 days a week alternating with other antidandruff  shampoos. Mometasone solution 4 days a week at bedtime Overnight washout morning as needed for scaliness and itching of scalp.  Seborrheic Dermatitis  -  is a chronic persistent rash characterized by pinkness and scaling most commonly of the mid face but also can occur on the scalp (dandruff), ears; mid chest and mid back. It tends to be exacerbated by stress and cooler weather.  People who have neurologic disease may experience new onset or exacerbation of existing seborrheic dermatitis.  The condition is not curable but treatable and can be controlled.  Ordered Medications: ketoconazole (NIZORAL) 2 % shampoo Other Related Medications mometasone (ELOCON) 0.1 % lotion   Lentigines - Scattered tan macules - Due to sun exposure - Benign-appering, observe - Recommend daily broad spectrum sunscreen SPF 30+ to sun-exposed areas, reapply every 2 hours as needed. - Call for any changes  Seborrheic Keratoses - Stuck-on, waxy, tan-brown papules and/or plaques  - Benign-appearing - Discussed benign etiology and prognosis. - Observe - Call for any changes  Melanocytic Nevi - Tan-brown and/or pink-flesh-colored symmetric macules and papules - Benign appearing on exam today - Observation - Call clinic for new or changing moles - Recommend daily use of broad spectrum spf 30+ sunscreen to sun-exposed areas.   Hemangiomas - Red papules - Discussed benign nature - Observe - Call for any changes  Actinic Damage - Chronic condition, secondary to cumulative UV/sun exposure - diffuse scaly erythematous macules with underlying dyspigmentation - Recommend daily broad spectrum sunscreen SPF 30+ to sun-exposed areas, reapply every 2 hours as needed.  - Staying in the shade or wearing long sleeves,  sun glasses (UVA+UVB protection) and wide brim hats (4-inch brim around the entire circumference of the hat) are also recommended for sun protection.  - Call for new or changing lesions.  History of  Basal Cell Carcinoma of the Skin Forehead 05/10/2019 - No evidence of recurrence today - Recommend regular full body skin exams - Recommend daily broad spectrum sunscreen SPF 30+ to sun-exposed areas, reapply every 2 hours as needed.  - Call if any new or changing lesions are noted between office visits  Skin cancer screening performed today.  Return in about 1 year (around 07/28/2021) for TBSE, Hx of BCC .  IMarye Round, CMA, am acting as scribe for Sarina Ser, MD .  Documentation: I have reviewed the above documentation for accuracy and completeness, and I agree with the above.  Sarina Ser, MD

## 2020-07-29 ENCOUNTER — Encounter: Payer: Self-pay | Admitting: Dermatology

## 2020-07-29 DIAGNOSIS — L219 Seborrheic dermatitis, unspecified: Secondary | ICD-10-CM

## 2020-08-01 ENCOUNTER — Encounter: Payer: Self-pay | Admitting: Dermatology

## 2020-08-01 MED ORDER — MOMETASONE FUROATE 0.1 % EX SOLN: CUTANEOUS | 0 refills | Status: DC

## 2020-08-09 DIAGNOSIS — R7303 Prediabetes: Secondary | ICD-10-CM | POA: Diagnosis not present

## 2020-08-09 DIAGNOSIS — Z0000 Encounter for general adult medical examination without abnormal findings: Secondary | ICD-10-CM | POA: Diagnosis not present

## 2020-08-16 DIAGNOSIS — Z0000 Encounter for general adult medical examination without abnormal findings: Secondary | ICD-10-CM | POA: Diagnosis not present

## 2020-08-16 DIAGNOSIS — F325 Major depressive disorder, single episode, in full remission: Secondary | ICD-10-CM | POA: Diagnosis not present

## 2020-08-16 DIAGNOSIS — R7303 Prediabetes: Secondary | ICD-10-CM | POA: Diagnosis not present

## 2020-08-16 DIAGNOSIS — K219 Gastro-esophageal reflux disease without esophagitis: Secondary | ICD-10-CM | POA: Diagnosis not present

## 2020-10-17 DIAGNOSIS — M955 Acquired deformity of pelvis: Secondary | ICD-10-CM | POA: Diagnosis not present

## 2020-10-17 DIAGNOSIS — M5114 Intervertebral disc disorders with radiculopathy, thoracic region: Secondary | ICD-10-CM | POA: Diagnosis not present

## 2020-10-17 DIAGNOSIS — M9903 Segmental and somatic dysfunction of lumbar region: Secondary | ICD-10-CM | POA: Diagnosis not present

## 2020-10-17 DIAGNOSIS — M9905 Segmental and somatic dysfunction of pelvic region: Secondary | ICD-10-CM | POA: Diagnosis not present

## 2020-11-14 DIAGNOSIS — M25561 Pain in right knee: Secondary | ICD-10-CM | POA: Diagnosis not present

## 2020-11-16 ENCOUNTER — Encounter: Payer: Self-pay | Admitting: Obstetrics and Gynecology

## 2020-11-16 ENCOUNTER — Ambulatory Visit (INDEPENDENT_AMBULATORY_CARE_PROVIDER_SITE_OTHER): Payer: PPO | Admitting: Obstetrics and Gynecology

## 2020-11-16 ENCOUNTER — Other Ambulatory Visit: Payer: Self-pay

## 2020-11-16 VITALS — BP 146/79 | HR 62 | Ht 62.0 in | Wt 142.0 lb

## 2020-11-16 DIAGNOSIS — N393 Stress incontinence (female) (male): Secondary | ICD-10-CM

## 2020-11-16 DIAGNOSIS — N3281 Overactive bladder: Secondary | ICD-10-CM

## 2020-11-16 DIAGNOSIS — R350 Frequency of micturition: Secondary | ICD-10-CM | POA: Diagnosis not present

## 2020-11-16 LAB — POCT URINALYSIS DIPSTICK
Appearance: NORMAL
Blood, UA: NEGATIVE
Glucose, UA: NEGATIVE
Ketones, UA: NEGATIVE
Leukocytes, UA: NEGATIVE
Nitrite, UA: NEGATIVE
Protein, UA: NEGATIVE
Spec Grav, UA: 1.015 (ref 1.010–1.025)
Urobilinogen, UA: 0.2 E.U./dL
pH, UA: 6.5 (ref 5.0–8.0)

## 2020-11-16 NOTE — Progress Notes (Signed)
[facility] New Patient Evaluation and Consultation  Referring Provider: Kirk Ruths, MD PCP: Kirk Ruths, MD Date of Service: 11/16/2020  SUBJECTIVE Chief Complaint: New Patient (Initial Visit) (PVR 19)  History of Present Illness: Kimberly Leon is a 75 y.o. White or Caucasian female presenting for evaluation of incontinence.     Urinary Symptoms: Leaks urine with cough/ sneeze and laughing. Sometimes will just happen without movement.  Leaks 0-1 time(s) per day.  Pad use: liners/ mini-pads per day.   She is bothered by her UI symptoms.  Day time voids 10.  Nocturia: 0 times per night to void. Voiding dysfunction: she does not empty her bladder well.  does not use a catheter to empty bladder.  When urinating, she feels to push on her belly or vagina to empty bladder  UTIs:  0  UTI's in the last year.   Denies history of blood in urine and kidney or bladder stones  Pelvic Organ Prolapse Symptoms:                  She Denies a feeling of a bulge the vaginal area.   Bowel Symptom: Bowel movements: every 2 days  Stool consistency: hard or soft  Straining: no.  Splinting: no.  Incomplete evacuation: no.  She Denies accidental bowel leakage / fecal incontinence Bowel regimen: none Last colonoscopy: Date 3 years ago  Sexual Function Sexually active: yes.  Pain with sex: No  Pelvic Pain Denies pelvic pain   Past Medical History:  Past Medical History:  Diagnosis Date   Allergic rhinitis    Arthritis    Basal cell carcinoma 05/10/2019   forehead    Cancer (HCC)    skin ca-basal cell   Endometrial polyp    Family history of adverse reaction to anesthesia    mother-- ponv   GERD (gastroesophageal reflux disease)    Sinus congestion    Wears contact lenses      Past Surgical History:   Past Surgical History:  Procedure Laterality Date   APPENDECTOMY  1967   BLEPHAROPLASTY Bilateral 04/2016   upper eyelid   COLONOSCOPY  2014    COMBINED HYSTEROSCOPY DIAGNOSTIC / D&C  08-24-2004   dr Cherylann Banas [facility]   DILATATION & CURETTAGE/HYSTEROSCOPY WITH MYOSURE N/A 06/03/2017   Procedure: DILATATION & CURETTAGE/HYSTEROSCOPY WITH MYOSURE RESECTION OF ENDOMETRIAL POLYP;  Surgeon: Anastasio Auerbach, MD;  Location: [facility];  Service: Gynecology;  Laterality: N/A;  request to follow 7:30am case inf [facility] block time requests one hour   EXCISION MORTON'S NEUROMA Right 2016   FOOT SURGERY Bilateral 03/1998   removal bone spurs   KNEE ARTHROSCOPY Right 05/05/2020   Procedure: ARTHROSCOPY KNEE;  Surgeon: Hessie Knows, MD;  Location: [facility];  Service: Orthopedics;  Laterality: Right;   MOHS SURGERY     ROTATOR CUFF REPAIR Right 2010   TUBAL LIGATION  yrs ago     Past OB/GYN History: OB History  Gravida Para Term Preterm AB Living  '2 2 2     2  '$ SAB IAB Ectopic Multiple Live Births          2    # Outcome Date GA Lbr Len/2nd Weight Sex Delivery Anes PTL Lv  2 Term           1 Term            SVD x2 Menopausal: Yes, at age 54, Denies vaginal bleeding since menopause Last pap smear  was 2021- neg.  Any history of abnormal pap smears: no.   Medications: She has a current medication list which includes the following prescription(s): cetirizine, citalopram, cyanocobalamin, estradiol-levonorgestrel, etodolac, fluorometholone, hydrocortisone, ketoconazole, mometasone, mometasone, omeprazole, trazodone, and vitamin d.   Allergies: Patient has No Known Allergies.   Social History:  Social History   Tobacco Use   Smoking status: Never   Smokeless tobacco: Never  Vaping Use   Vaping Use: Never used  Substance Use Topics   Alcohol use: Yes    Alcohol/week: 5.0 standard drinks    Types: 5 Standard drinks or equivalent per week   Drug use: No    Relationship status: married She lives with her husband.   She is not employed. Regular exercise: Yes: weights, pilates, golf, tennis,  pickleball History of abuse: No  Family History:   Family History  Problem Relation Age of Onset   Hypertension Mother    Diabetes Mother    Heart disease Mother    Heart disease Father    Breast cancer Maternal Aunt        Age 42's     Review of Systems: Review of Systems  Constitutional:  Negative for fever, malaise/fatigue and weight loss.  Respiratory:  Negative for cough, shortness of breath and wheezing.   Cardiovascular:  Negative for chest pain, palpitations and leg swelling.  Gastrointestinal:  Negative for abdominal pain and blood in stool.  Genitourinary:  Negative for dysuria.  Musculoskeletal:  Negative for myalgias.  Skin:  Negative for rash.  Neurological:  Negative for dizziness and headaches.  Endo/Heme/Allergies:  Does not bruise/bleed easily.       +hot flashes  Psychiatric/Behavioral:  Positive for depression. The patient is not nervous/anxious.     OBJECTIVE Physical Exam: Vitals:   11/16/20 1056  BP: (!) 146/79  Pulse: 62  Weight: 142 lb (64.4 kg)  Height: '5\' 2"'$  (1.575 m)    Physical Exam Constitutional:      General: She is not in acute distress. Pulmonary:     Effort: Pulmonary effort is normal.  Abdominal:     General: There is no distension.     Palpations: Abdomen is soft.     Tenderness: There is no abdominal tenderness. There is no rebound.  Musculoskeletal:        General: No swelling. Normal range of motion.  Skin:    General: Skin is warm and dry.     Findings: No rash.  Neurological:     Mental Status: She is alert and oriented to person, place, and time.  Psychiatric:        Mood and Affect: Mood normal.        Behavior: Behavior normal.     GU / Detailed Urogynecologic Evaluation:  Pelvic Exam: Normal external female genitalia; Bartholin's and Skene's glands normal in appearance; urethral meatus normal in appearance, no urethral masses or discharge.   CST: negative  Speculum exam reveals normal vaginal mucosa with  atrophy. Cervix normal appearance. Uterus normal single, nontender. Adnexa no mass, fullness, tenderness.    Pelvic floor strength II/V  Pelvic floor musculature: Right levator non-tender, Right obturator non-tender, Left levator non-tender, Left obturator non-tender. Unable to relax pelvic floor with bearing down, instead contracts muscles with valsalva.   POP-Q:   POP-Q  -1.5  Aa   -1.5                                           Ba  -6                                              C   3                                            Gh  3.5                                            Pb  8                                            tvl   -2                                            Ap  -2                                            Bp  -6                                              D     Rectal Exam:  Normal external rectum  Post-Void Residual (PVR) by Bladder Scan: In order to evaluate bladder emptying, we discussed obtaining a postvoid residual and she agreed to this procedure.  Procedure: The ultrasound unit was placed on the patient's abdomen in the suprapubic region after the patient had voided. A PVR of 19 ml was obtained by bladder scan.  Laboratory Results: POC urine: negative   ASSESSMENT AND PLAN Ms. Veeder is a 75 y.o. with:  1. Stress incontinence   2. Detrusor dyssynergia   3. Urinary frequency    SUI For treatment of stress urinary incontinence, which is leakage with physical activity/movement/strainging/coughing, we discussed expectant management versus nonsurgical options versus surgery. Nonsurgical options include weight loss, physical therapy, as well as a pessary. Surgical options are also available.  - She would like to try a pessary and physical therapy  2. Pelvic floor dyssynergia - We reviewed how she does not appropriately relax her muscles with valsalva and how this can contribute to sensation of  hesitation with voiding or incomplete bladder emptying. Has normal PVR. - Recommended using a stool with voiding. Also provided handout on diaphragmatic breathing -She will also work with pelvic PT for this symptom.   Jaquita Folds, MD   Time spent: I spent 45 minutes  dedicated to the care of this patient on the date of this encounter to include pre-visit review of records, face-to-face time with the patient and post visit documentation.

## 2020-12-05 ENCOUNTER — Ambulatory Visit: Payer: PPO | Admitting: Nurse Practitioner

## 2020-12-05 ENCOUNTER — Ambulatory Visit: Payer: PPO | Admitting: Obstetrics and Gynecology

## 2020-12-15 ENCOUNTER — Other Ambulatory Visit: Payer: Self-pay

## 2020-12-15 MED ORDER — ESTRADIOL-LEVONORGESTREL 0.045-0.015 MG/DAY TD PTWK: 1.0000 | MEDICATED_PATCH | TRANSDERMAL | 0 refills | Status: DC

## 2020-12-15 NOTE — Telephone Encounter (Signed)
Last AEX 9.9.21 with JK Scheduled 01/24/21 with TW

## 2021-01-16 ENCOUNTER — Other Ambulatory Visit: Payer: Self-pay | Admitting: Nurse Practitioner

## 2021-01-16 NOTE — Telephone Encounter (Signed)
TW's patient.  AEX 12/03/19 with Dr. Delilah Shan. Scheduled 01/24/21 with Marny Lowenstein.  Mammo due 03/2021. Current.

## 2021-01-17 ENCOUNTER — Ambulatory Visit: Payer: PPO | Admitting: Dermatology

## 2021-01-17 ENCOUNTER — Encounter: Payer: Self-pay | Admitting: Dermatology

## 2021-01-17 ENCOUNTER — Other Ambulatory Visit: Payer: Self-pay

## 2021-01-17 DIAGNOSIS — L820 Inflamed seborrheic keratosis: Secondary | ICD-10-CM

## 2021-01-17 DIAGNOSIS — B078 Other viral warts: Secondary | ICD-10-CM | POA: Diagnosis not present

## 2021-01-17 DIAGNOSIS — L821 Other seborrheic keratosis: Secondary | ICD-10-CM | POA: Diagnosis not present

## 2021-01-17 NOTE — Patient Instructions (Addendum)
Recommend OTC Gold Bond Rapid Relief Anti-Itch cream (pramoxine + menthol), CeraVe Anti-itch cream or lotion (pramoxine), Sarna lotion (Original- menthol + camphor or Sensitive- pramoxine) or Eucerin 12 hour Itch Relief lotion (menthol) up to 3 times per day to areas on body that are itchy.     Cryotherapy Aftercare  Wash gently with soap and water everyday.   Apply Vaseline and Band-Aid daily until healed.     If you have any questions or concerns for your doctor, please call our main line at [phone] and press option 4 to reach your doctor's medical assistant. If no one answers, please leave a voicemail as directed and we will return your call as soon as possible. Messages left after 4 pm will be answered the following business day.   You may also send [place] a message via [place]. We typically respond to MyChart messages within 1-2 business days.  For prescription refills, please ask your pharmacy to contact our office. Our fax number is [phone].  If you have an urgent issue when the clinic is closed that cannot wait until the next business day, you can page your doctor at the number below.    Please note that while we do our best to be available for urgent issues outside of office hours, we are not available 24/7.   If you have an urgent issue and are unable to reach [place], you may choose to seek medical care at your doctor's office, retail clinic, urgent care center, or emergency room.  If you have a medical emergency, please immediately call 911 or go to the emergency department.  Pager Numbers  - Dr. Nehemiah Massed: [phone]  - Dr. Laurence Ferrari: [phone]  - Dr. Nicole Kindred: [phone]  In the event of inclement weather, please call our main line at [phone] for an update on the status of any delays or closures.  Dermatology Medication Tips: Please keep the boxes that topical medications come in in order to help keep track of the instructions about where and how to use these.  Pharmacies typically print the medication instructions only on the boxes and not directly on the medication tubes.   If your medication is too expensive, please contact our office at [phone] option 4 or send [place] a message through [place].   We are unable to tell what your co-pay for medications will be in advance as this is different depending on your insurance coverage. However, we may be able to find a substitute medication at lower cost or fill out paperwork to get insurance to cover a needed medication.   If a prior authorization is required to get your medication covered by your insurance company, please allow [place] 1-2 business days to complete this process.  Drug prices often vary depending on where the prescription is filled and some pharmacies may offer cheaper prices.  The website www.goodrx.com contains coupons for medications through different pharmacies. The prices here do not account for what the cost may be with help from insurance (it may be cheaper with your insurance), but the website can give you the price if you did not use any insurance.  - You can print the associated coupon and take it with your prescription to the pharmacy.  - You may also stop by our office during regular business hours and pick up a GoodRx coupon card.  - If you need your prescription sent electronically to a different pharmacy, notify our office through [facility] or by phone at [phone] option 4. If you  have any questions or concerns for your doctor, please call our main line at [phone] and press option 4 to reach your doctor's medical assistant. If no one answers, please leave a voicemail as directed and we will return your call as soon as possible. Messages left after 4 pm will be answered the following business day.   You may also send [place] a message via [place]. We typically respond to MyChart messages within 1-2 business days.  For prescription refills, please ask your pharmacy to  contact our office. Our fax number is [phone].  If you have an urgent issue when the clinic is closed that cannot wait until the next business day, you can page your doctor at the number below.    Please note that while we do our best to be available for urgent issues outside of office hours, we are not available 24/7.   If you have an urgent issue and are unable to reach [place], you may choose to seek medical care at your doctor's office, retail clinic, urgent care center, or emergency room.  If you have a medical emergency, please immediately call 911 or go to the emergency department.  Pager Numbers  - Dr. Nehemiah Massed: [phone]  - Dr. Laurence Ferrari: [phone]  - Dr. Nicole Kindred: [phone]  In the event of inclement weather, please call our main line at [phone] for an update on the status of any delays or closures.  Dermatology Medication Tips: Please keep the boxes that topical medications come in in order to help keep track of the instructions about where and how to use these. Pharmacies typically print the medication instructions only on the boxes and not directly on the medication tubes.   If your medication is too expensive, please contact our office at [phone] option 4 or send [place] a message through [place].   We are unable to tell what your co-pay for medications will be in advance as this is different depending on your insurance coverage. However, we may be able to find a substitute medication at lower cost or fill out paperwork to get insurance to cover a needed medication.   If a prior authorization is required to get your medication covered by your insurance company, please allow [place] 1-2 business days to complete this process.  Drug prices often vary depending on where the prescription is filled and some pharmacies may offer cheaper prices.  The website www.goodrx.com contains coupons for medications through different pharmacies. The prices here do not account for what  the cost may be with help from insurance (it may be cheaper with your insurance), but the website can give you the price if you did not use any insurance.  - You can print the associated coupon and take it with your prescription to the pharmacy.  - You may also stop by our office during regular business hours and pick up a GoodRx coupon card.  - If you need your prescription sent electronically to a different pharmacy, notify our office through [facility] or by phone at [phone] option 4.

## 2021-01-17 NOTE — Progress Notes (Signed)
   Follow-Up Visit   Subjective  Kimberly Leon is a 75 y.o. female who presents for the following: Skin Problem (Check irritated itchy spots on the upper back ) and Warts (Growth on the left cheek growing and changing color ).   The following portions of the chart were reviewed this encounter and updated as appropriate:   Tobacco  Allergies  Meds  Problems  Med Hx  Surg Hx  Fam Hx      Review of Systems:  No other skin or systemic complaints except as noted in HPI or Assessment and Plan.  Objective  Well appearing patient in no apparent distress; mood and affect are within normal limits.  A focused examination was performed including face,back. Relevant physical exam findings are noted in the Assessment and Plan.  left cheek Verrucous papules -- Discussed viral etiology and contagion.   back x 14 (14) Erythematous keratotic or waxy stuck-on papule or plaque.    Assessment & Plan  Other viral warts left cheek  Wart within a ISK  Discussed viral etiology and risk of spread.  Discussed multiple treatments may be required to clear warts.  Discussed possible post-treatment dyspigmentation and risk of recurrence.    Prior to procedure, discussed risks of blister formation, small wound, skin dyspigmentation, or rare scar following cryotherapy. Recommend Vaseline ointment to treated areas while healing.   Destruction of lesion - left cheek  Destruction method: cryotherapy   Informed consent: discussed and consent obtained   Lesion destroyed using liquid nitrogen: Yes   Cryotherapy cycles:  2 Outcome: patient tolerated procedure well with no complications   Post-procedure details: wound care instructions given    Inflamed seborrheic keratosis back x 14  Itchy  Prior to procedure, discussed risks of blister formation, small wound, skin dyspigmentation, or rare scar following cryotherapy. Recommend Vaseline ointment to treated areas while healing.   Destruction of  lesion - back x 14 Complexity: simple   Destruction method: cryotherapy   Informed consent: discussed and consent obtained   Timeout:  patient name, date of birth, surgical site, and procedure verified Lesion destroyed using liquid nitrogen: Yes   Region frozen until ice ball extended beyond lesion: Yes   Outcome: patient tolerated procedure well with no complications   Post-procedure details: wound care instructions given    Seborrheic Keratoses - Stuck-on, waxy, tan-brown papules and/or plaques  - Benign-appearing - Discussed benign etiology and prognosis. - Observe - Call for any changes   Return in about 1 month (around 02/17/2021) for wart, ISK .  I, Kimberly Leon, CMA, am acting as scribe for Forest Gleason, MD .   Documentation: I have reviewed the above documentation for accuracy and completeness, and I agree with the above.  Forest Gleason, MD

## 2021-01-24 ENCOUNTER — Ambulatory Visit: Payer: PPO | Admitting: Nurse Practitioner

## 2021-01-25 DIAGNOSIS — M79672 Pain in left foot: Secondary | ICD-10-CM | POA: Diagnosis not present

## 2021-01-25 DIAGNOSIS — M7672 Peroneal tendinitis, left leg: Secondary | ICD-10-CM | POA: Diagnosis not present

## 2021-01-25 DIAGNOSIS — M19072 Primary osteoarthritis, left ankle and foot: Secondary | ICD-10-CM | POA: Diagnosis not present

## 2021-01-25 DIAGNOSIS — M25372 Other instability, left ankle: Secondary | ICD-10-CM | POA: Diagnosis not present

## 2021-01-25 DIAGNOSIS — M2022 Hallux rigidus, left foot: Secondary | ICD-10-CM | POA: Diagnosis not present

## 2021-02-13 DIAGNOSIS — R7303 Prediabetes: Secondary | ICD-10-CM | POA: Diagnosis not present

## 2021-02-13 DIAGNOSIS — Z0000 Encounter for general adult medical examination without abnormal findings: Secondary | ICD-10-CM | POA: Diagnosis not present

## 2021-02-13 DIAGNOSIS — E7800 Pure hypercholesterolemia, unspecified: Secondary | ICD-10-CM | POA: Diagnosis not present

## 2021-02-21 ENCOUNTER — Ambulatory Visit (INDEPENDENT_AMBULATORY_CARE_PROVIDER_SITE_OTHER): Payer: PPO | Admitting: Nurse Practitioner

## 2021-02-21 ENCOUNTER — Encounter: Payer: Self-pay | Admitting: Nurse Practitioner

## 2021-02-21 ENCOUNTER — Other Ambulatory Visit: Payer: Self-pay

## 2021-02-21 VITALS — BP 126/82 | Ht 62.0 in | Wt 145.0 lb

## 2021-02-21 DIAGNOSIS — Z79890 Hormone replacement therapy (postmenopausal): Secondary | ICD-10-CM

## 2021-02-21 DIAGNOSIS — Z01419 Encounter for gynecological examination (general) (routine) without abnormal findings: Secondary | ICD-10-CM

## 2021-02-21 DIAGNOSIS — Z780 Asymptomatic menopausal state: Secondary | ICD-10-CM

## 2021-02-21 MED ORDER — CLIMARA PRO 0.045-0.015 MG/DAY TD PTWK: 1.0000 | MEDICATED_PATCH | TRANSDERMAL | 4 refills | Status: DC

## 2021-02-21 NOTE — Progress Notes (Signed)
   Kimberly Leon 03/01/1946 [id number]   History:  75 y.o. [id number] presents for breast and pelvic exam. Postmenopausal - on HRT, no bleeding. Normal pap and mammogram history.   Gynecologic History No LMP recorded. Patient is postmenopausal.   Contraception: post menopausal status Sexually active: Yes  Health Maintenance Last Pap: 12/03/2019. Results were: Normal Last mammogram: 04/04/2020. Results were: right breast calcifications - benign biopsy Last colonoscopy: 2014. Results were: Normal, 10-year recall Last Dexa: 04/11/2017. Results were: Normal  Past medical history, past surgical history, family history and social history were all reviewed and documented in the EPIC chart. Married. 2 children, grandchildren ages 6-19. Oldest at [place].   ROS:  A ROS was performed and pertinent positives and negatives are included.  Exam:  Vitals:   02/21/21 0957  BP: 126/82  Weight: 145 lb (65.8 kg)  Height: 5\' 2"  (1.575 m)   Body mass index is 26.52 kg/m.  General appearance:  Normal Thyroid:  Symmetrical, normal in size, without palpable masses or nodularity. Respiratory  Auscultation:  Clear without wheezing or rhonchi Cardiovascular  Auscultation:  Regular rate, without rubs, murmurs or gallops  Edema/varicosities:  Not grossly evident Abdominal  Soft,nontender, without masses, guarding or rebound.  Liver/spleen:  No organomegaly noted  Hernia:  None appreciated  Skin  Inspection:  Grossly normal Breasts: Examined lying and sitting.   Right: Without masses, retractions, nipple discharge or axillary adenopathy.   Left: Without masses, retractions, nipple discharge or axillary adenopathy. Genitourinary   Inguinal/mons:  Normal without inguinal adenopathy  External genitalia:  Normal appearing vulva with no masses, tenderness, or lesions  BUS/Urethra/Skene's glands:  Normal  Vagina:  Normal appearing with normal color and discharge, no lesions. Atrophic changes.    Cervix:  Normal appearing without discharge or lesions  Uterus:  Normal in size, shape and contour.  Midline and mobile, nontender  Adnexa/parametria:     Rt: Normal in size, without masses or tenderness.   Lt: Normal in size, without masses or tenderness.  Anus and perineum: Normal  Digital rectal exam: Normal sphincter tone without palpated masses or tenderness  Patient informed chaperone available to be present for breast and pelvic exam. Patient has requested no chaperone to be present. Patient has been advised what will be completed during breast and pelvic exam.   Assessment/Plan:  75 y.o. [id number] for breast and pelvic exam.   Well female exam with routine gynecological exam - Education provided on SBEs, importance of preventative screenings, current guidelines, high calcium diet, regular exercise, and multivitamin daily.  Labs with PCP.   Postmenopausal - on HRT, no bleeding.   Hormone replacement therapy (HRT) - Plan: estradiol-levonorgestrel (CLIMARA PRO) 0.045-0.015 MG/DAY weekly. She has tried to wean in the past but does not tolerate. She is aware of risks with continued use. Refill x 1 year provided.   Screening for cervical cancer - Normal Pap history. Discussed current guidelines and option to stop screening. She is unsure. We will reassess when she is due for next pap.   Screening for breast cancer - Had benign biopsy and clip placement of right breast 03/2020. Continue annual screenings.  Normal breast exam today.  Screening for colon cancer - 2014 colonoscopy. Will repeat at GI's recommended interval.   Screening for osteoporosis - Normal bone density in 2019. Will repeat at 5-year interval per recommendation.   Return in 2 years for breast and pelvic exam.   Tamela Gammon DNP, 10:08 AM 02/21/2021

## 2021-03-02 ENCOUNTER — Ambulatory Visit: Payer: PPO | Admitting: Dermatology

## 2021-03-15 DIAGNOSIS — M19072 Primary osteoarthritis, left ankle and foot: Secondary | ICD-10-CM | POA: Diagnosis not present

## 2021-03-15 DIAGNOSIS — M2022 Hallux rigidus, left foot: Secondary | ICD-10-CM | POA: Diagnosis not present

## 2021-03-15 DIAGNOSIS — M79672 Pain in left foot: Secondary | ICD-10-CM | POA: Diagnosis not present

## 2021-03-15 NOTE — Discharge Instructions (Addendum)
[place]  POST OPERATIVE INSTRUCTIONS FOR DR. Vickki Muff AND DR. Baidland   Take your medication as prescribed.  Pain medication should be taken only as needed.  You may also supplement with Tylenol or ibuprofen as needed.  Keep the dressing clean, dry and intact.  Keep your foot elevated above the heart level for the first 48 hours.  Continue elevation thereafter to improve swelling.  You may also place ice on the dorsal aspect of the foot for maximum 10 minutes out of every 1 hour as needed for swelling control.  Walking to the bathroom and brief periods of walking are acceptable, unless we have instructed you to be non-weight bearing.  Always wear your post-op shoe when walking.  Always use your crutches if you are to be non-weight bearing.  Try to walk with heel contact in a surgical shoe.  Do not walk barefoot, need to be wearing the surgical shoe at all times when ambulating.  Try to only walk for short distances until your incision is healed.  Do not take a shower. Baths are permissible as long as the foot is kept out of the water.   Every hour you are awake:  Bend your knee 15 times. Flex foot 15 times Massage calf 15 times  Call [facility] [phone]) if any of the following problems occur: You develop a temperature or fever. The bandage becomes saturated with blood. Medication does not stop your pain. Injury of the foot occurs. Any symptoms of infection including redness, odor, or red streaks running from wound.

## 2021-03-16 ENCOUNTER — Encounter: Payer: Self-pay | Admitting: Podiatry

## 2021-03-16 ENCOUNTER — Other Ambulatory Visit: Payer: Self-pay | Admitting: Podiatry

## 2021-03-21 DIAGNOSIS — R928 Other abnormal and inconclusive findings on diagnostic imaging of breast: Secondary | ICD-10-CM | POA: Diagnosis not present

## 2021-03-30 ENCOUNTER — Other Ambulatory Visit: Payer: Self-pay

## 2021-03-30 ENCOUNTER — Ambulatory Visit: Payer: PPO | Admitting: Anesthesiology

## 2021-03-30 ENCOUNTER — Encounter
Admission: RE | Disposition: A | Discharge status: Home / Self Care | Payer: Self-pay | Source: Home / Self Care | Attending: Podiatry

## 2021-03-30 ENCOUNTER — Encounter: Payer: Self-pay | Admitting: Podiatry

## 2021-03-30 ENCOUNTER — Ambulatory Visit
Admission: RE | Admit: 2021-03-30 | Discharge: 2021-03-30 | Disposition: A | Discharge status: Home / Self Care | Payer: PPO | Attending: Podiatry | Admitting: Podiatry

## 2021-03-30 ENCOUNTER — Ambulatory Visit: Payer: Self-pay

## 2021-03-30 DIAGNOSIS — D1632 Benign neoplasm of short bones of left lower limb: Secondary | ICD-10-CM | POA: Diagnosis not present

## 2021-03-30 DIAGNOSIS — M7752 Other enthesopathy of left foot: Secondary | ICD-10-CM | POA: Diagnosis not present

## 2021-03-30 DIAGNOSIS — M898X7 Other specified disorders of bone, ankle and foot: Secondary | ICD-10-CM | POA: Diagnosis not present

## 2021-03-30 DIAGNOSIS — M2022 Hallux rigidus, left foot: Secondary | ICD-10-CM | POA: Insufficient documentation

## 2021-03-30 DIAGNOSIS — M19072 Primary osteoarthritis, left ankle and foot: Secondary | ICD-10-CM | POA: Insufficient documentation

## 2021-03-30 HISTORY — PX: CHEILECTOMY: SHX1336

## 2021-03-30 SURGERY — CHEILECTOMY
Anesthesia: General | Site: Foot | Laterality: Left

## 2021-03-30 MED ORDER — LACTATED RINGERS IV SOLN: INTRAVENOUS | Status: DC

## 2021-03-30 MED ORDER — ACETAMINOPHEN 10 MG/ML IV SOLN
INTRAVENOUS | Status: DC | PRN
  Administered 2021-03-30: 1000 mg via INTRAVENOUS

## 2021-03-30 MED ORDER — 0.9 % SODIUM CHLORIDE (POUR BTL) OPTIME
TOPICAL | Status: DC | PRN
  Administered 2021-03-30: 500 mL

## 2021-03-30 MED ORDER — DEXAMETHASONE SODIUM PHOSPHATE 4 MG/ML IJ SOLN
INTRAMUSCULAR | Status: DC | PRN
Start: 2021-03-30 — End: 2021-03-30
  Administered 2021-03-30: 4 mg via INTRAVENOUS

## 2021-03-30 MED ORDER — OXYCODONE HCL 5 MG PO TABS: 5.0000 mg | ORAL_TABLET | Freq: Once | ORAL | Status: DC | PRN

## 2021-03-30 MED ORDER — ACETAMINOPHEN 160 MG/5ML PO SOLN: 325.0000 mg | ORAL | Status: DC | PRN

## 2021-03-30 MED ORDER — ONDANSETRON HCL 4 MG/2ML IJ SOLN: 4.0000 mg | Freq: Once | INTRAMUSCULAR | Status: DC | PRN

## 2021-03-30 MED ORDER — ACETAMINOPHEN 325 MG PO TABS: 325.0000 mg | ORAL_TABLET | ORAL | Status: DC | PRN

## 2021-03-30 MED ORDER — FENTANYL CITRATE (PF) 100 MCG/2ML IJ SOLN
INTRAMUSCULAR | Status: DC | PRN
  Administered 2021-03-30 (×4): 25 ug via INTRAVENOUS

## 2021-03-30 MED ORDER — GLYCOPYRROLATE 0.2 MG/ML IJ SOLN
INTRAMUSCULAR | Status: DC | PRN
Start: 2021-03-30 — End: 2021-03-30
  Administered 2021-03-30: .1 mg via INTRAVENOUS

## 2021-03-30 MED ORDER — ONDANSETRON HCL 4 MG/2ML IJ SOLN
INTRAMUSCULAR | Status: DC | PRN
  Administered 2021-03-30: 4 mg via INTRAVENOUS

## 2021-03-30 MED ORDER — CEFAZOLIN SODIUM-DEXTROSE 2-4 GM/100ML-% IV SOLN
2.0000 g | INTRAVENOUS | Status: AC
  Administered 2021-03-30: 2 g via INTRAVENOUS

## 2021-03-30 MED ORDER — FENTANYL CITRATE PF 50 MCG/ML IJ SOSY: 25.0000 ug | PREFILLED_SYRINGE | INTRAMUSCULAR | Status: DC | PRN

## 2021-03-30 MED ORDER — PROPOFOL 10 MG/ML IV BOLUS
INTRAVENOUS | Status: DC | PRN
  Administered 2021-03-30: 130 mg via INTRAVENOUS

## 2021-03-30 MED ORDER — OXYCODONE HCL 5 MG/5ML PO SOLN: 5.0000 mg | Freq: Once | ORAL | Status: DC | PRN

## 2021-03-30 MED ORDER — KETOROLAC TROMETHAMINE 30 MG/ML IJ SOLN
INTRAMUSCULAR | Status: DC | PRN
  Administered 2021-03-30: 30 mg via INTRAVENOUS

## 2021-03-30 MED ORDER — MIDAZOLAM HCL 5 MG/5ML IJ SOLN
INTRAMUSCULAR | Status: DC | PRN
  Administered 2021-03-30: 2 mg via INTRAVENOUS

## 2021-03-30 MED ORDER — HYDROCODONE-ACETAMINOPHEN 5-325 MG PO TABS: 1.0000 | ORAL_TABLET | Freq: Four times a day (QID) | ORAL | 0 refills | Status: AC | PRN

## 2021-03-30 MED ORDER — LIDOCAINE HCL (CARDIAC) PF 100 MG/5ML IV SOSY
PREFILLED_SYRINGE | INTRAVENOUS | Status: DC | PRN
  Administered 2021-03-30: 60 mg via INTRATRACHEAL

## 2021-03-30 MED ORDER — BUPIVACAINE HCL (PF) 0.25 % IJ SOLN
INTRAMUSCULAR | Status: DC | PRN
  Administered 2021-03-30: 20 mL

## 2021-03-30 SURGICAL SUPPLY — 31 items
BLADE MED AGGRESSIVE (BLADE) ×1 IMPLANT
BNDG CMPR 75X41 PLY HI ABS (GAUZE/BANDAGES/DRESSINGS) ×1
BNDG COHESIVE 4X5 TAN ST LF (GAUZE/BANDAGES/DRESSINGS) ×2 IMPLANT
BNDG ESMARK 4X12 TAN STRL LF (GAUZE/BANDAGES/DRESSINGS) ×2 IMPLANT
BNDG GAUZE ELAST 4 BULKY (GAUZE/BANDAGES/DRESSINGS) ×2 IMPLANT
BNDG STRETCH 4X75 STRL LF (GAUZE/BANDAGES/DRESSINGS) ×2 IMPLANT
CANISTER SUCT 1200ML W/VALVE (MISCELLANEOUS) ×2 IMPLANT
COVER LIGHT HANDLE UNIVERSAL (MISCELLANEOUS) ×4 IMPLANT
CUFF TOURN SGL QUICK 18X4 (TOURNIQUET CUFF) ×1 IMPLANT
DRAPE FLUOR MINI C-ARM 54X84 (DRAPES) ×2 IMPLANT
DURAPREP 26ML APPLICATOR (WOUND CARE) ×2 IMPLANT
ELECT REM PT RETURN 9FT ADLT (ELECTROSURGICAL) ×2
ELECTRODE REM PT RTRN 9FT ADLT (ELECTROSURGICAL) ×1 IMPLANT
GAUZE SPONGE 4X4 12PLY STRL (GAUZE/BANDAGES/DRESSINGS) ×2 IMPLANT
GAUZE XEROFORM 1X8 LF (GAUZE/BANDAGES/DRESSINGS) ×2 IMPLANT
GLOVE SURG NEOPR MICRO LF SZ7 (GLOVE) ×2 IMPLANT
GLOVE SURG POLYISO LF SZ7 (GLOVE) ×4 IMPLANT
GLOVE SURG UNDER POLY LF SZ7 (GLOVE) ×2 IMPLANT
GOWN STRL REUS W/ TWL LRG LVL3 (GOWN DISPOSABLE) ×2 IMPLANT
GOWN STRL REUS W/TWL LRG LVL3 (GOWN DISPOSABLE) ×4
KIT TURNOVER KIT A (KITS) ×2 IMPLANT
NS IRRIG 500ML POUR BTL (IV SOLUTION) ×2 IMPLANT
PACK EXTREMITY ARMC (MISCELLANEOUS) ×2 IMPLANT
PIN BALLS 3/8 F/.045 WIRE (MISCELLANEOUS) ×2 IMPLANT
STOCKINETTE IMPERVIOUS LG (DRAPES) ×2 IMPLANT
SUT ETHILON 3-0 FS-10 30 BLK (SUTURE) ×2
SUT VIC AB 3-0 SH 27 (SUTURE) ×2
SUT VIC AB 3-0 SH 27X BRD (SUTURE) IMPLANT
SUT VIC AB 4-0 FS2 27 (SUTURE) ×1 IMPLANT
SUTURE EHLN 3-0 FS-10 30 BLK (SUTURE) IMPLANT
post op shoe small ×1 IMPLANT

## 2021-03-30 NOTE — Transfer of Care (Signed)
Immediate Anesthesia Transfer of Care Note  Patient: Kimberly Leon  Procedure(s) Performed: CHEILECTOMY (Left: Foot)  Patient Location: PACU  Anesthesia Type: General LMA  Level of Consciousness: awake, alert  and patient cooperative  Airway and Oxygen Therapy: Patient Spontanous Breathing and Patient connected to supplemental oxygen  Post-op Assessment: Post-op Vital signs reviewed, Patient's Cardiovascular Status Stable, Respiratory Function Stable, Patent Airway and No signs of Nausea or vomiting  Post-op Vital Signs: Reviewed and stable  Complications: No notable events documented.

## 2021-03-30 NOTE — H&P (Signed)
HISTORY AND PHYSICAL INTERVAL NOTE:  03/30/2021  7:18 AM  Kimberly Leon  has presented today for surgery, with the diagnosis of M20.2- Hallux rigidus, LEFT.  The various methods of treatment have been discussed with the patient.  No guarantees were given.  After consideration of risks, benefits and other options for treatment, the patient has consented to surgery.  I have reviewed the patients chart and labs.    PROCEDURE: LEFT 1ST MTPJ CHELIECTOMY  A history and physical examination was performed in my office.  The patient was reexamined.  There have been no changes to this history and physical examination.  Caroline More, DPM

## 2021-03-30 NOTE — Anesthesia Preprocedure Evaluation (Signed)
Anesthesia Evaluation  Patient identified by MRN, date of birth, ID band Patient awake    Reviewed: Allergy & Precautions, H&P , NPO status , Patient's Chart, lab work & pertinent test results  Airway Mallampati: II  TM Distance: >3 FB Neck ROM: full    Dental no notable dental hx.    Pulmonary    Pulmonary exam normal breath sounds clear to auscultation       Cardiovascular Normal cardiovascular exam Rhythm:regular Rate:Normal     Neuro/Psych    GI/Hepatic GERD  ,  Endo/Other    Renal/GU      Musculoskeletal   Abdominal   Peds  Hematology   Anesthesia Other Findings   Reproductive/Obstetrics                             Anesthesia Physical Anesthesia Plan  ASA: 2  Anesthesia Plan: General LMA   Post-op Pain Management: Minimal or no pain anticipated and Regional block   Induction:   PONV Risk Score and Plan: 3 and Treatment may vary due to age or medical condition, Ondansetron, Dexamethasone and Midazolam  Airway Management Planned:   Additional Equipment:   Intra-op Plan:   Post-operative Plan:   Informed Consent: I have reviewed the patients History and Physical, chart, labs and discussed the procedure including the risks, benefits and alternatives for the proposed anesthesia with the patient or authorized representative who has indicated his/her understanding and acceptance.     Dental Advisory Given  Plan Discussed with: CRNA  Anesthesia Plan Comments:         Anesthesia Quick Evaluation

## 2021-03-30 NOTE — Anesthesia Procedure Notes (Signed)
Procedure Name: LMA Insertion Date/Time: 03/30/2021 7:35 AM Performed by: Dionne Bucy, CRNA Pre-anesthesia Checklist: Patient identified, Patient being monitored, Timeout performed, Emergency Drugs available and Suction available Patient Re-evaluated:Patient Re-evaluated prior to induction Oxygen Delivery Method: Circle system utilized Preoxygenation: Pre-oxygenation with 100% oxygen Induction Type: IV induction Ventilation: Mask ventilation without difficulty LMA: LMA inserted LMA Size: 4.0 Tube type: Oral Number of attempts: 1 Placement Confirmation: positive ETCO2 and breath sounds checked- equal and bilateral Tube secured with: Tape Dental Injury: Teeth and Oropharynx as per pre-operative assessment

## 2021-03-30 NOTE — Op Note (Signed)
PODIATRY / FOOT AND ANKLE SURGERY OPERATIVE REPORT    SURGEON: Caroline More, DPM  PRE-OPERATIVE DIAGNOSIS:  1.  Left hallux rigidus, dorsal exostosis  POST-OPERATIVE DIAGNOSIS: Same  PROCEDURE(S): Left first metatarsal phalangeal joint cheilectomy with removal of dorsal bone spur  HEMOSTASIS: Left ankle tourniquet  ANESTHESIA: MAC  ESTIMATED BLOOD LOSS: 10 cc  FINDING(S): 1.  Severe osteoarthritis to the left first metatarsal phalange joint with large dorsal medial bone spur at the joint level  PATHOLOGY/SPECIMEN(S): None  INDICATIONS:   Kimberly Leon is a 76 y.o. female who presents with a painful bump to the dorsal medial aspect of the left first metatarsal phalangeal joint.  Patient does not note any pain with range of motion to the first metatarsal phalangeal joint and has very limited range of motion overall to the joint.  Patient mainly complains of the bump that is on the foot and states that it rubs against the shoe and causes pain discomfort.  Patient has exhausted conservative measures consisting of changes in shoe gear, padding, strapping, taping and would like to go forward with surgical intervention at this time.  Discussed all treatment options the patient both conservative and surgical attempts at correction including potential risks and complications at this time patient is elected for surgical intervention consisting of left first metatarsal phalange joint cheilectomy with resection of dorsal bone spur.  No guarantees given.  Consent obtained prior to procedure.  DESCRIPTION: After obtaining full informed written consent, the patient was brought back to the operating room and placed supine upon the operating table.  The patient received IV antibiotics prior to induction.  After obtaining adequate anesthesia, a preoperative block was performed about the first ray with 20 cc of quarter percent Marcaine plain.  The patient was prepped and draped in the standard fashion.   The Esmarch bandage was used to exsanguinate the left lower extremity and the pneumatic ankle tourniquet was inflated.  Attention was directed to the left first metatarsal phalangeal joint where a linear longitudinal incision was made over the area of the exostosis at the dorsal medial aspect medial to the tendon of the extensor hallucis longus.  The incision was deepened through the subcutaneous tissues utilizing sharp and blunt dissection care was taken to identify and retract all vital neurovascular structures no venous contributories were cauterized as necessary.  At this time a capsular incision was made medial to the tendon of the extensor hallucis longus involve the contour of the deformity.  The capsular and periosteal tissue was reflected medially and laterally at the operative site thereby exposing the first metatarsal phalangeal joint.  The large dorsal exostosis as well as bone fragments were resected and passed off in the operative site removing the large prominence presents which was causing the patient pain discomfort preoperatively.  The area was smoothed to a normal contour.  C-arm imaging was utilized to verify correct position of resected bone which appeared to be excellent and appeared to be adequately resected and the AP, lateral, and lateral oblique views.  There appeared to be severe degeneration of the first metatarsal phalangeal joint with minimal to no cartilage.  After the exostosis was performed and the joint surface was contoured smooth there is still did not appear to be much range of motion to the first metatarsal phalangeal joint.  The joint appeared to be partially fused even.  The surgical site was flushed with copious amounts normal sterile saline.  The periosteal and capsular structures were reapproximated well coapted with  3-0 Vicryl.  The subcutaneous tissue was reapproximated well coapted with 4-0 Vicryl.  The skin was then reapproximated well coapted with 4-0 nylon in a  combination of simple and horizontal mattress type stitching.  The pneumatic ankle tourniquet was deflated and a prompt hyperemic response was noted all digits of the left foot.  A postoperative dressing was applied consisting of Xeroform followed by 4 x 4 gauze, Kling, Kerlix, Ace wrap and postoperative surgical shoe.  Patient was brought back to the recovery room vital signs stable vascular status intact to all toes left foot, patient tolerated procedure and anesthesia well.  Patient be discharged with the appropriate orders and follow-up instructions.  Patient to follow-up in clinic 1 week after surgery.  COMPLICATIONS: None  CONDITION: Good, stable  Caroline More, DPM

## 2021-03-30 NOTE — Anesthesia Postprocedure Evaluation (Signed)
Anesthesia Post Note  Patient: Kimberly Leon  Procedure(s) Performed: CHEILECTOMY (Left: Foot)     Patient location during evaluation: PACU Anesthesia Type: General Level of consciousness: awake and alert and oriented Pain management: satisfactory to patient Vital Signs Assessment: post-procedure vital signs reviewed and stable Respiratory status: spontaneous breathing, nonlabored ventilation and respiratory function stable Cardiovascular status: blood pressure returned to baseline and stable Postop Assessment: Adequate PO intake and No signs of nausea or vomiting Anesthetic complications: no   No notable events documented.  Raliegh Ip

## 2021-04-05 DIAGNOSIS — M19072 Primary osteoarthritis, left ankle and foot: Secondary | ICD-10-CM | POA: Diagnosis not present

## 2021-04-05 DIAGNOSIS — M2022 Hallux rigidus, left foot: Secondary | ICD-10-CM | POA: Diagnosis not present

## 2021-04-05 DIAGNOSIS — M79672 Pain in left foot: Secondary | ICD-10-CM | POA: Diagnosis not present

## 2021-06-02 DIAGNOSIS — M9905 Segmental and somatic dysfunction of pelvic region: Secondary | ICD-10-CM | POA: Diagnosis not present

## 2021-06-02 DIAGNOSIS — M5114 Intervertebral disc disorders with radiculopathy, thoracic region: Secondary | ICD-10-CM | POA: Diagnosis not present

## 2021-06-02 DIAGNOSIS — M955 Acquired deformity of pelvis: Secondary | ICD-10-CM | POA: Diagnosis not present

## 2021-06-02 DIAGNOSIS — M9903 Segmental and somatic dysfunction of lumbar region: Secondary | ICD-10-CM | POA: Diagnosis not present

## 2021-07-05 ENCOUNTER — Ambulatory Visit: Payer: PPO | Admitting: Podiatry

## 2021-07-07 DIAGNOSIS — M25451 Effusion, right hip: Secondary | ICD-10-CM | POA: Diagnosis not present

## 2021-07-07 DIAGNOSIS — M76891 Other specified enthesopathies of right lower limb, excluding foot: Secondary | ICD-10-CM | POA: Diagnosis not present

## 2021-07-07 DIAGNOSIS — M25551 Pain in right hip: Secondary | ICD-10-CM | POA: Diagnosis not present

## 2021-07-07 DIAGNOSIS — M1611 Unilateral primary osteoarthritis, right hip: Secondary | ICD-10-CM | POA: Diagnosis not present

## 2021-07-24 ENCOUNTER — Ambulatory Visit: Payer: PPO | Admitting: Podiatry

## 2021-07-24 ENCOUNTER — Ambulatory Visit (INDEPENDENT_AMBULATORY_CARE_PROVIDER_SITE_OTHER): Payer: PPO

## 2021-07-24 DIAGNOSIS — M205X2 Other deformities of toe(s) (acquired), left foot: Secondary | ICD-10-CM

## 2021-07-24 NOTE — Progress Notes (Signed)
? ?HPI: 76 y.o. female very active and healthy presenting today for evaluation of pain and stiffness to the left great toe.  Patient states that she has a history of bilateral foot surgeries.  Surgeries included cheilectomy to the first MTP bilateral as well as Morton's neurectomy bilateral.  Patient states that most recently she had left great toe surgery 03/30/2021 with Dr. Luana Shu at [facility].  Patient states that since surgery she has had increased pain and tenderness to the left great toe joint.  She presents today for second opinion and further treatment and evaluation ? ?Past Medical History:  ?Diagnosis Date  ? Allergic rhinitis   ? Arthritis   ? Basal cell carcinoma 05/10/2019  ? forehead   ? Cancer [facility])   ? skin ca-basal cell  ? Endometrial polyp   ? Family history of adverse reaction to anesthesia   ? mother-- ponv  ? GERD (gastroesophageal reflux disease)   ? Sinus congestion   ? Wears contact lenses   ? not currently (03/16/21)  ? ? ?Past Surgical History:  ?Procedure Laterality Date  ? APPENDECTOMY  1967  ? BLEPHAROPLASTY Bilateral 04/2016  ? upper eyelid  ? CHEILECTOMY Left 03/30/2021  ? Procedure: CHEILECTOMY;  Surgeon: Caroline More, DPM;  Location: [place];  Service: Podiatry;  Laterality: Left;  ? COLONOSCOPY  2014  ? COMBINED HYSTEROSCOPY DIAGNOSTIC / D&C  08-24-2004   dr Cherylann Banas [facility]  ? DILATATION & CURETTAGE/HYSTEROSCOPY WITH MYOSURE N/A 06/03/2017  ? Procedure: DILATATION & CURETTAGE/HYSTEROSCOPY WITH MYOSURE RESECTION OF ENDOMETRIAL POLYP;  Surgeon: Anastasio Auerbach, MD;  Location: [place];  Service: Gynecology;  Laterality: N/A;  request to follow 7:30am case inf [place] [facility] block time ?requests one hour  ? EXCISION MORTON'S NEUROMA Right 2016  ? FOOT SURGERY Bilateral 03/1998  ? removal bone spurs  ? KNEE ARTHROSCOPY Right 05/05/2020  ? Procedure: ARTHROSCOPY KNEE;  Surgeon: Hessie Knows, MD;  Location: [facility];  Service: Orthopedics;  Laterality:  Right;  ? MOHS SURGERY    ? ROTATOR CUFF REPAIR Right 2010  ? TUBAL LIGATION  yrs ago  ? ? ?No Known Allergies ?  ?Physical Exam: ?General: The patient is alert and oriented x3 in no acute distress. ? ?Dermatology: Skin is warm, dry and supple bilateral lower extremities. Negative for open lesions or macerations. ? ?Vascular: Palpable pedal pulses bilaterally. Capillary refill within normal limits.  Negative for any significant edema or erythema ? ?Neurological: Light touch and protective threshold grossly intact ? ?Musculoskeletal Exam: No pedal deformities noted.  There is pain on palpation with limited range of motion of the first MTP left ? ?Radiographic Exam:  ?Degenerative changes noted to the first MTP left with joint space narrowing.  There is no periarticular spurring given the history of cheilectomy ? ?Assessment: ?1.  Advanced symptomatic hallux limitus left ? ? ?Plan of Care:  ?1. Patient evaluated. X-Rays reviewed.  ?2.  The patient has had conservative treatment before including orthotics.  She continues to wear orthotics ?3.  Continue OTC anti-inflammatory as needed ?4.  Ultimately I do think arthroplasty with implant VS.  Arthrodesis is warranted given the advanced degenerative changes to the left great toe joint.  I explained this in detail with the patient and she agrees and understands.  All patient questions answered.  Patient would like to pursue surgery in the wintertime so she can be active throughout the summer ?5.  Recommend stiff soled shoes that do not allow much movement to  the great toe joint to help support it ?6.  Return to clinic as needed for surgical consult ? ?*Golfs, pickleball, tennis ? ?  ?  ?Edrick Kins, DPM ?[place] ? ?Dr. Edrick Kins, DPM  ?  ?[address]                                        ?[place], [place] [address]                ?Office [phone]  ?Fax [phone] ? ? ? ? ?

## 2021-07-28 ENCOUNTER — Ambulatory Visit
Admission: RE | Admit: 2021-07-28 | Discharge: 2021-07-28 | Disposition: A | Discharge status: Home / Self Care | Payer: PPO | Source: Ambulatory Visit

## 2021-07-28 VITALS — BP 131/71 | HR 74 | Temp 98.4°F | Resp 18

## 2021-07-28 DIAGNOSIS — J302 Other seasonal allergic rhinitis: Secondary | ICD-10-CM

## 2021-07-28 DIAGNOSIS — J309 Allergic rhinitis, unspecified: Secondary | ICD-10-CM | POA: Diagnosis not present

## 2021-07-28 DIAGNOSIS — R0981 Nasal congestion: Secondary | ICD-10-CM

## 2021-07-28 MED ORDER — PREDNISONE 10 MG (21) PO TBPK
ORAL_TABLET | Freq: Every day | ORAL | 0 refills | Status: DC
Start: 2021-07-28 — End: 2022-03-14

## 2021-07-28 NOTE — ED Provider Notes (Signed)
?[facility] ? ? ? ?CSN: [id number] ?Arrival date & time: 07/28/21  1727 ? ? ?  ? ?History   ?Chief Complaint ?Chief Complaint  ?Patient presents with  ? Sinus Pressure  ? ? ?HPI ?Kimberly Leon is a 76 y.o. female.  Patient presents with 2 to 3-day history of sinus congestion, sinus pressure, postnasal drip, cough.  No fever, rash, sore throat, shortness of breath, vomiting, diarrhea, or other symptoms.  Treatment at home with Zyrtec.  Her medical history includes allergic rhinitis. ? ?The history is provided by the patient and medical records.  ? ?Past Medical History:  ?Diagnosis Date  ? Allergic rhinitis   ? Arthritis   ? Basal cell carcinoma 05/10/2019  ? forehead   ? Cancer [facility])   ? skin ca-basal cell  ? Endometrial polyp   ? Family history of adverse reaction to anesthesia   ? mother-- ponv  ? GERD (gastroesophageal reflux disease)   ? Sinus congestion   ? Wears contact lenses   ? not currently (03/16/21)  ? ? ?Patient Active Problem List  ? Diagnosis Date Noted  ? HIP PAIN, RIGHT 01/17/2010  ? SCIATICA, RIGHT 01/17/2010  ? ? ?Past Surgical History:  ?Procedure Laterality Date  ? APPENDECTOMY  1967  ? BLEPHAROPLASTY Bilateral 04/2016  ? upper eyelid  ? CHEILECTOMY Left 03/30/2021  ? Procedure: CHEILECTOMY;  Surgeon: Caroline More, DPM;  Location: [place];  Service: Podiatry;  Laterality: Left;  ? COLONOSCOPY  2014  ? COMBINED HYSTEROSCOPY DIAGNOSTIC / D&C  08-24-2004   dr Cherylann Banas [facility]  ? DILATATION & CURETTAGE/HYSTEROSCOPY WITH MYOSURE N/A 06/03/2017  ? Procedure: DILATATION & CURETTAGE/HYSTEROSCOPY WITH MYOSURE RESECTION OF ENDOMETRIAL POLYP;  Surgeon: Anastasio Auerbach, MD;  Location: [place];  Service: Gynecology;  Laterality: N/A;  request to follow 7:30am case inf [place] [facility] block time ?requests one hour  ? EXCISION MORTON'S NEUROMA Right 2016  ? FOOT SURGERY Bilateral 03/1998  ? removal bone spurs  ? KNEE ARTHROSCOPY Right 05/05/2020  ? Procedure: ARTHROSCOPY  KNEE;  Surgeon: Hessie Knows, MD;  Location: [facility];  Service: Orthopedics;  Laterality: Right;  ? MOHS SURGERY    ? ROTATOR CUFF REPAIR Right 2010  ? TUBAL LIGATION  yrs ago  ? ? ?OB History   ? ? Gravida  ?2  ? Para  ?2  ? Term  ?2  ? Preterm  ?   ? AB  ?   ? Living  ?2  ?  ? ? SAB  ?   ? IAB  ?   ? Ectopic  ?   ? Multiple  ?   ? Live Births  ?2  ?   ?  ?  ? ? ? ?Home Medications   ? ?Prior to Admission medications   ?Medication Sig Start Date End Date Taking? Authorizing Provider  ?omeprazole (PRILOSEC) 20 MG capsule Take by mouth. 06/29/21  Yes [provider]  ?predniSONE (STERAPRED UNI-PAK 21 TAB) 10 MG (21) TBPK tablet Take by mouth daily. As directed 07/28/21  Yes Sharion Balloon, NP  ?cetirizine (ZYRTEC) 10 MG tablet Take 10 mg by mouth every morning.    [provider]  ?citalopram (CELEXA) 40 MG tablet Take 40 mg by mouth every morning.    [provider]  ?Cyanocobalamin (B-12 PO) Take 1 tablet by mouth daily.    [provider]  ?estradiol-levonorgestrel (CLIMARA PRO) 0.045-0.015 MG/DAY Place 1 patch onto the skin once a week.  02/21/21   Tamela Gammon, NP  ?hydrocortisone 2.5 % lotion APPLY TO AFFECTED AREAS EVERY OTHER DAY AS NEEDED 06/02/20   Ralene Bathe, MD  ?ketoconazole (NIZORAL) 2 % shampoo Apply Shampoo at least 3-4 times a week prn flares 07/28/20   Ralene Bathe, MD  ?meloxicam (MOBIC) 15 MG tablet Take by mouth. 07/10/21   [provider]  ?traZODone (DESYREL) 150 MG tablet Take 150 mg by mouth at bedtime.    [provider]  ?VITAMIN D PO Take 1 capsule by mouth daily.    [provider]  ? ? ?Family History ?Family History  ?Problem Relation Age of Onset  ? Hypertension Mother   ? Diabetes Mother   ? Heart disease Mother   ? Heart disease Father   ? Breast cancer Maternal Aunt   ?     Age 71's  ? ? ?Social History ?Social History  ? ?Tobacco Use  ? Smoking status: Never  ? Smokeless tobacco: Never  ?Vaping Use  ? Vaping  Use: Never used  ?Substance Use Topics  ? Alcohol use: Yes  ?  Alcohol/week: 7.0 standard drinks  ?  Types: 7 Glasses of wine per week  ? Drug use: No  ? ? ? ?Allergies   ?Patient has no known allergies. ? ? ?Review of Systems ?Review of Systems  ?Constitutional:  Negative for chills and fever.  ?HENT:  Positive for congestion, postnasal drip and sinus pressure. Negative for ear pain and sore throat.   ?Respiratory:  Positive for cough. Negative for shortness of breath.   ?Cardiovascular:  Negative for chest pain and palpitations.  ?Gastrointestinal:  Negative for diarrhea and vomiting.  ?Skin:  Negative for color change and rash.  ?All other systems reviewed and are negative. ? ? ?Physical Exam ?Triage Vital Signs ?ED Triage Vitals [07/28/21 1739]  ?Enc Vitals Group  ?   BP 131/71  ?   Pulse Rate 74  ?   Resp 18  ?   Temp 98.4 ?F (36.9 ?C)  ?   Temp src   ?   SpO2 95 %  ?   Weight   ?   Height   ?   Head Circumference   ?   Peak Flow   ?   Pain Score   ?   Pain Loc   ?   Pain Edu?   ?   Excl. in [place]?   ? ?No data found. ? ?Updated Vital Signs ?BP 131/71   Pulse 74   Temp 98.4 ?F (36.9 ?C)   Resp 18   SpO2 95%  ? ?Visual Acuity ?Right Eye Distance:   ?Left Eye Distance:   ?Bilateral Distance:   ? ?Right Eye Near:   ?Left Eye Near:    ?Bilateral Near:    ? ?Physical Exam ?Vitals and nursing note reviewed.  ?Constitutional:   ?   General: She is not in acute distress. ?   Appearance: Normal appearance. She is well-developed. She is not ill-appearing.  ?HENT:  ?   Right Ear: Tympanic membrane normal.  ?   Left Ear: Tympanic membrane normal.  ?   Nose: Nose normal.  ?   Mouth/Throat:  ?   Mouth: Mucous membranes are moist.  ?   Pharynx: Oropharynx is clear.  ?   Comments: Clear postnasal drip ?Cardiovascular:  ?   Rate and Rhythm: Normal rate and regular rhythm.  ?   Heart sounds: Normal heart sounds.  ?Pulmonary:  ?  Effort: Pulmonary effort is normal. No respiratory distress.  ?   Breath sounds: Normal breath  sounds.  ?Musculoskeletal:  ?   Cervical back: Neck supple.  ?Skin: ?   General: Skin is warm and dry.  ?Neurological:  ?   Mental Status: She is alert.  ?Psychiatric:     ?   Mood and Affect: Mood normal.     ?   Behavior: Behavior normal.  ? ? ? ?UC Treatments / Results  ?Labs ?(all labs ordered are listed, but only abnormal results are displayed) ?Labs Reviewed - No data to display ? ?EKG ? ? ?Radiology ?No results found. ? ?Procedures ?Procedures (including critical care time) ? ?Medications Ordered in UC ?Medications - No data to display ? ?Initial Impression / Assessment and Plan / UC Course  ?I have reviewed the triage vital signs and the nursing notes. ? ?Pertinent labs & imaging results that were available during my care of the patient were reviewed by me and considered in my medical decision making (see chart for details). ? ?Sinus congestion, allergic rhinitis, seasonal allergies.  Instructed patient to continue using Zyrtec and start Flonase nasal spray.  Treating with prednisone taper.  Education provided on allergic rhinitis.  Instructed patient to follow-up with her PCP if her symptoms are not improving.  She agrees to plan of care. ? ? ?Final Clinical Impressions(s) / UC Diagnoses  ? ?Final diagnoses:  ?Sinus congestion  ?Allergic rhinitis, unspecified seasonality, unspecified trigger  ?Seasonal allergies  ? ? ? ?Discharge Instructions   ? ?  ?Take the prednisone as directed.  Use Flonase nasal spray as directed.  Continue taking Zyrtec as directed.  Follow up with your primary care provider if your symptoms are not improving.   ? ? ? ? ? ?ED Prescriptions   ? ? Medication Sig Dispense Auth. Provider  ? predniSONE (STERAPRED UNI-PAK 21 TAB) 10 MG (21) TBPK tablet Take by mouth daily. As directed 21 tablet Sharion Balloon, NP  ? ?  ? ?PDMP not reviewed this encounter. ?  ?Sharion Balloon, NP ?07/28/21 1759 ? ?

## 2021-07-28 NOTE — Discharge Instructions (Addendum)
Take the prednisone as directed.  Use Flonase nasal spray as directed.  Continue taking Zyrtec as directed.  Follow up with your primary care provider if your symptoms are not improving.   ? ?

## 2021-07-28 NOTE — ED Triage Notes (Signed)
Pt presents with sinus pressure/pain, cough and drainage x 3 days  ?

## 2021-08-03 ENCOUNTER — Encounter: Payer: PPO | Admitting: Dermatology

## 2021-08-10 ENCOUNTER — Ambulatory Visit: Payer: PPO | Admitting: Dermatology

## 2021-08-10 DIAGNOSIS — L988 Other specified disorders of the skin and subcutaneous tissue: Secondary | ICD-10-CM | POA: Diagnosis not present

## 2021-08-10 DIAGNOSIS — D1800 Hemangioma unspecified site: Secondary | ICD-10-CM | POA: Diagnosis not present

## 2021-08-10 DIAGNOSIS — L814 Other melanin hyperpigmentation: Secondary | ICD-10-CM

## 2021-08-10 DIAGNOSIS — L821 Other seborrheic keratosis: Secondary | ICD-10-CM | POA: Diagnosis not present

## 2021-08-10 DIAGNOSIS — L570 Actinic keratosis: Secondary | ICD-10-CM

## 2021-08-10 DIAGNOSIS — Z85828 Personal history of other malignant neoplasm of skin: Secondary | ICD-10-CM

## 2021-08-10 DIAGNOSIS — L578 Other skin changes due to chronic exposure to nonionizing radiation: Secondary | ICD-10-CM | POA: Diagnosis not present

## 2021-08-10 DIAGNOSIS — D229 Melanocytic nevi, unspecified: Secondary | ICD-10-CM | POA: Diagnosis not present

## 2021-08-10 DIAGNOSIS — L918 Other hypertrophic disorders of the skin: Secondary | ICD-10-CM

## 2021-08-10 DIAGNOSIS — Z1283 Encounter for screening for malignant neoplasm of skin: Secondary | ICD-10-CM

## 2021-08-10 NOTE — Patient Instructions (Addendum)
Cryotherapy Aftercare  Wash gently with soap and water everyday.   Apply Vaseline and Band-Aid daily until healed.   Revision - Nectifirm Alastin - Restorative Neck Cream  Melanoma ABCDEs  Melanoma is the most dangerous type of skin cancer, and is the leading cause of death from skin disease.  You are more likely to develop melanoma if you: Have light-colored skin, light-colored eyes, or red or blond hair Spend a lot of time in the sun Tan regularly, either outdoors or in a tanning bed Have had blistering sunburns, especially during childhood Have a close family member who has had a melanoma Have atypical moles or large birthmarks  Early detection of melanoma is key since treatment is typically straightforward and cure rates are extremely high if we catch it early.   The first sign of melanoma is often a change in a mole or a new dark spot.  The ABCDE system is a way of remembering the signs of melanoma.  A for asymmetry:  The two halves do not match. B for border:  The edges of the growth are irregular. C for color:  A mixture of colors are present instead of an even brown color. D for diameter:  Melanomas are usually (but not always) greater than 13m - the size of a pencil eraser. E for evolution:  The spot keeps changing in size, shape, and color.  Please check your skin once per month between visits. You can use a small mirror in front and a large mirror behind you to keep an eye on the back side or your body.   If you see any new or changing lesions before your next follow-up, please call to schedule a visit.  Please continue daily skin protection including broad spectrum sunscreen SPF 30+ to sun-exposed areas, reapplying every 2 hours as needed when you're outdoors.     If You Need Anything After Your Visit  If you have any questions or concerns for your doctor, please call our main line at [phone] press option 4 to reach your doctor's medical assistant. If no one  answers, please leave a voicemail as directed and we will return your call as soon as possible. Messages left after 4 pm will be answered the following business day.   You may also send [place] message via [facility] We typically respond to MyChart messages within 1-2 business days.  For prescription refills, please ask your pharmacy to contact our office. Our fax number is [phone]  If you have an urgent issue when the clinic is closed that cannot wait until the next business day, you can page your doctor at the number below.    Please note that while we do our best to be available for urgent issues outside of office hours, we are not available 24/7.   If you have an urgent issue and are unable to reach [place] you may choose to seek medical care at your doctor's office, retail clinic, urgent care center, or emergency room.  If you have a medical emergency, please immediately call 911 or go to the emergency department.  Pager Numbers  - Dr. KNehemiah Massed [phone] - Dr. MLaurence Ferrari [phone] - Dr. SNicole Kindred [phone] In the event of inclement weather, please call our main line at [phone] an update on the status of any delays or closures.  Dermatology Medication Tips: Please keep the boxes that topical medications come in in order to help keep track of the instructions about where and  how to use these. Pharmacies typically print the medication instructions only on the boxes and not directly on the medication tubes.   If your medication is too expensive, please contact our office at [phone] option 4 or send [place] a message through [place].   We are unable to tell what your co-pay for medications will be in advance as this is different depending on your insurance coverage. However, we may be able to find a substitute medication at lower cost or fill out paperwork to get insurance to cover a needed medication.   If a prior authorization is required to get your medication covered  by your insurance company, please allow [place] 1-2 business days to complete this process.  Drug prices often vary depending on where the prescription is filled and some pharmacies may offer cheaper prices.  The website www.goodrx.com contains coupons for medications through different pharmacies. The prices here do not account for what the cost may be with help from insurance (it may be cheaper with your insurance), but the website can give you the price if you did not use any insurance.  - You can print the associated coupon and take it with your prescription to the pharmacy.  - You may also stop by our office during regular business hours and pick up a GoodRx coupon card.  - If you need your prescription sent electronically to a different pharmacy, notify our office through [facility] or by phone at [phone] option 4.     Si Usted Necesita Algo Despus de Su Visita  Tambin puede enviarnos un mensaje a travs de Pharmacist, community. Por lo general respondemos a los mensajes de MyChart en el transcurso de 1 a 2 das hbiles.  Para renovar recetas, por favor pida a su farmacia que se ponga en contacto con nuestra oficina. Harland Dingwall de fax es [place] [phone].  Si tiene un asunto urgente cuando la clnica est cerrada y que no puede esperar hasta el siguiente da hbil, puede llamar/localizar a su doctor(a) al nmero que aparece a continuacin.   Por favor, tenga en cuenta que aunque hacemos todo lo posible para estar disponibles para asuntos urgentes fuera del horario de [place], no estamos disponibles las 24 horas del da, los 7 das de la [place].   Si tiene un problema urgente y no puede comunicarse con nosotros, puede optar por buscar atencin mdica  en el consultorio de su doctor(a), en una clnica privada, en un centro de atencin urgente o en una sala de emergencias.  Si tiene Engineering geologist, por favor llame inmediatamente al 911 o vaya a la sala de emergencias.  Nmeros de  bper  - Dr. Nehemiah Massed: [phone]  - Dra. Moye: [phone]  - Dra. Nicole Kindred: [phone]  En caso de inclemencias del [place], por favor llame a Johnsie Kindred principal al [phone] para una actualizacin sobre el [place] de cualquier retraso o cierre.  Consejos para la medicacin en dermatologa: Por favor, guarde las cajas en las que vienen los medicamentos de uso tpico para ayudarle a seguir las instrucciones sobre dnde y cmo usarlos. Las farmacias generalmente imprimen las instrucciones del medicamento slo en las cajas y no directamente en los tubos del [place].   Si su medicamento es muy caro, por favor, pngase en contacto con Zigmund Daniel llamando al [phone] y presione la opcin 4 o envenos un mensaje a travs de Pharmacist, community.   No podemos decirle cul ser su copago por los medicamentos por adelantado ya que esto es diferente  dependiendo de la cobertura de su seguro. Sin embargo, es posible que podamos encontrar un medicamento sustituto a Electrical engineer un formulario para que el seguro cubra el medicamento que se considera necesario.   Si se requiere una autorizacin previa para que su compaa de seguros [place] su medicamento, por favor permtanos de 1 a 2 das hbiles para completar este proceso.  Los precios de los medicamentos varan con frecuencia dependiendo del Environmental consultant de dnde se surte la receta y alguna farmacias pueden ofrecer precios ms baratos.  El sitio web www.goodrx.com tiene cupones para medicamentos de Airline pilot. Los precios aqu no tienen en cuenta lo que podra costar con la ayuda del seguro (puede ser ms barato con su seguro), pero el sitio web puede darle el precio si no utiliz Research scientist (physical sciences).  - Puede imprimir el cupn correspondiente y llevarlo con su receta a la farmacia.  - Tambin puede pasar por nuestra oficina durante el horario de atencin regular y Charity fundraiser una tarjeta de cupones de GoodRx.  - Si necesita que su receta se  enve electrnicamente a una farmacia diferente, informe a nuestra oficina a travs de MyChart de [place] o por telfono llamando al [phone] y presione la opcin 4.

## 2021-08-10 NOTE — Progress Notes (Signed)
Follow-Up Visit   Subjective  Kimberly Leon is a 76 y.o. female who presents for the following: Annual Exam (The patient presents for Total-Body Skin Exam (TBSE) for skin cancer screening and mole check.  The patient has spots, moles and lesions to be evaluated, some may be new or changing and the patient has concerns that these could be cancer. Patient with hx of BCC at forehead. ).  The following portions of the chart were reviewed this encounter and updated as appropriate:   Tobacco  Allergies  Meds  Problems  Med Hx  Surg Hx  Fam Hx     Review of Systems:  No other skin or systemic complaints except as noted in HPI or Assessment and Plan.  Objective  Well appearing patient in no apparent distress; mood and affect are within normal limits.  A full examination was performed including scalp, head, eyes, ears, nose, lips, neck, chest, axillae, abdomen, back, buttocks, bilateral upper extremities, bilateral lower extremities, hands, feet, fingers, toes, fingernails, and toenails. All findings within normal limits unless otherwise noted below.  right mid helix Erythematous thin papules/macules with gritty scale.   face Rhytides and volume loss.    Assessment & Plan  AK (actinic keratosis) right ear mid helix  Destruction of lesion - right mid helix Complexity: simple   Destruction method: cryotherapy   Informed consent: discussed and consent obtained   Timeout:  patient name, date of birth, surgical site, and procedure verified Lesion destroyed using liquid nitrogen: Yes   Region frozen until ice ball extended beyond lesion: Yes   Outcome: patient tolerated procedure well with no complications   Post-procedure details: wound care instructions given    Elastosis of skin with lentigos and actinic changes Face Chronic and persistent condition with duration or expected duration over one year. Condition is symptomatic / bothersome to patient. Not to goal. May treat with  retinoid vs laser/ BBL If patient would like to treat with retinoid, recommend starting in the fall since patient is in the sun a lot.  Also can use Revisions Nectifirm or Alastin Restorative Neck Cream for crepiness of the neck skin.   Skin cancer screening  Lentigines - Scattered tan macules - Due to sun exposure - Benign-appearing, observe - Recommend daily broad spectrum sunscreen SPF 30+ to sun-exposed areas, reapply every 2 hours as needed. - Call for any changes  Seborrheic Keratoses - Stuck-on, waxy, tan-brown papules and/or plaques  - Benign-appearing - Discussed benign etiology and prognosis. - Observe - Call for any changes  Melanocytic Nevi - Tan-brown and/or pink-flesh-colored symmetric macules and papules - Benign appearing on exam today - Observation - Call clinic for new or changing moles - Recommend daily use of broad spectrum spf 30+ sunscreen to sun-exposed areas.   Hemangiomas - Red papules - Discussed benign nature - Observe - Call for any changes  Actinic Damage - Chronic condition, secondary to cumulative UV/sun exposure - diffuse scaly erythematous macules with underlying dyspigmentation - Recommend daily broad spectrum sunscreen SPF 30+ to sun-exposed areas, reapply every 2 hours as needed.  - Staying in the shade or wearing long sleeves, sun glasses (UVA+UVB protection) and wide brim hats (4-inch brim around the entire circumference of the hat) are also recommended for sun protection.  - Call for new or changing lesions.  Skin cancer screening performed today.  History of Basal Cell Carcinoma of the Skin - No evidence of recurrence today - Recommend regular full body skin exams - Recommend  daily broad spectrum sunscreen SPF 30+ to sun-exposed areas, reapply every 2 hours as needed.  - Call if any new or changing lesions are noted between office visits  Acrochordons (Skin Tags) - Fleshy, skin-colored pedunculated papules - Benign appearing.   - Observe. - If desired, they can be removed with an in office procedure that is not covered by insurance. - Please call the clinic if you notice any new or changing lesions.  Return in about 1 year (around 08/11/2022) for TBSE.  Graciella Belton, RMA, am acting as scribe for Sarina Ser, MD . Documentation: I have reviewed the above documentation for accuracy and completeness, and I agree with the above.  Sarina Ser, MD

## 2021-08-11 DIAGNOSIS — R2689 Other abnormalities of gait and mobility: Secondary | ICD-10-CM | POA: Diagnosis not present

## 2021-08-11 DIAGNOSIS — M25551 Pain in right hip: Secondary | ICD-10-CM | POA: Diagnosis not present

## 2021-08-20 ENCOUNTER — Encounter: Payer: Self-pay | Admitting: Dermatology

## 2021-08-31 DIAGNOSIS — M1611 Unilateral primary osteoarthritis, right hip: Secondary | ICD-10-CM | POA: Diagnosis not present

## 2021-08-31 DIAGNOSIS — M25551 Pain in right hip: Secondary | ICD-10-CM | POA: Diagnosis not present

## 2021-08-31 DIAGNOSIS — M25561 Pain in right knee: Secondary | ICD-10-CM | POA: Diagnosis not present

## 2021-09-01 DIAGNOSIS — M25551 Pain in right hip: Secondary | ICD-10-CM | POA: Diagnosis not present

## 2021-09-01 DIAGNOSIS — R2689 Other abnormalities of gait and mobility: Secondary | ICD-10-CM | POA: Diagnosis not present

## 2021-09-01 DIAGNOSIS — M1611 Unilateral primary osteoarthritis, right hip: Secondary | ICD-10-CM | POA: Insufficient documentation

## 2021-09-05 DIAGNOSIS — R2689 Other abnormalities of gait and mobility: Secondary | ICD-10-CM | POA: Diagnosis not present

## 2021-09-05 DIAGNOSIS — M25551 Pain in right hip: Secondary | ICD-10-CM | POA: Diagnosis not present

## 2021-09-07 DIAGNOSIS — M25551 Pain in right hip: Secondary | ICD-10-CM | POA: Diagnosis not present

## 2021-09-07 DIAGNOSIS — R2689 Other abnormalities of gait and mobility: Secondary | ICD-10-CM | POA: Diagnosis not present

## 2021-09-13 DIAGNOSIS — M25551 Pain in right hip: Secondary | ICD-10-CM | POA: Diagnosis not present

## 2021-09-15 DIAGNOSIS — M25551 Pain in right hip: Secondary | ICD-10-CM | POA: Diagnosis not present

## 2021-09-15 DIAGNOSIS — R2689 Other abnormalities of gait and mobility: Secondary | ICD-10-CM | POA: Diagnosis not present

## 2021-10-03 DIAGNOSIS — R7303 Prediabetes: Secondary | ICD-10-CM | POA: Diagnosis not present

## 2021-10-03 DIAGNOSIS — Z0000 Encounter for general adult medical examination without abnormal findings: Secondary | ICD-10-CM | POA: Diagnosis not present

## 2021-10-03 DIAGNOSIS — F325 Major depressive disorder, single episode, in full remission: Secondary | ICD-10-CM | POA: Diagnosis not present

## 2021-10-03 DIAGNOSIS — E7800 Pure hypercholesterolemia, unspecified: Secondary | ICD-10-CM | POA: Diagnosis not present

## 2021-10-06 DIAGNOSIS — M25552 Pain in left hip: Secondary | ICD-10-CM | POA: Diagnosis not present

## 2021-10-06 DIAGNOSIS — M1611 Unilateral primary osteoarthritis, right hip: Secondary | ICD-10-CM | POA: Diagnosis not present

## 2021-10-10 ENCOUNTER — Encounter: Payer: Self-pay | Admitting: Radiology

## 2021-10-10 ENCOUNTER — Ambulatory Visit (INDEPENDENT_AMBULATORY_CARE_PROVIDER_SITE_OTHER): Payer: PPO | Admitting: Radiology

## 2021-10-10 VITALS — BP 140/80

## 2021-10-10 DIAGNOSIS — N950 Postmenopausal bleeding: Secondary | ICD-10-CM

## 2021-10-10 NOTE — Progress Notes (Signed)
     Subjective:  76yo G2P2 presents with c/o irregular postmenopausal bleeding x1 week. Forgot to put her HRT patch on for about a week, unsure of specific time it was off. She replaced the patch and bleeding slowed over 5 days before stopping. Reports it was bright red, heavy enough to use a tampon. Has some associated cramping. No other symptoms.   Past Medical History:  Diagnosis Date   Allergic rhinitis    Arthritis    Basal cell carcinoma 05/10/2019   forehead    Cancer (HCC)    skin ca-basal cell   Endometrial polyp    Family history of adverse reaction to anesthesia    mother-- ponv   GERD (gastroesophageal reflux disease)    Sinus congestion    Wears contact lenses    not currently (03/16/21)    Objective:  Today's Vitals   10/10/21 1410  BP: 140/80   There is no height or weight on file to calculate BMI.   Pelvic exam: VULVA: normal appearing vulva with no masses, tenderness or lesions, VAGINA: atrophic, CERVIX: normal appearing cervix without discharge or lesions, UTERUS: uterus is normal size, shape, consistency and nontender, ADNEXA: normal adnexa in size, nontender and no masses.  Chaperone offered and declined for exam  Assessment/Plan: 1. PMB (postmenopausal bleeding)  - US Transvaginal Non-OB; Future

## 2021-10-24 ENCOUNTER — Other Ambulatory Visit: Payer: PPO

## 2021-10-24 ENCOUNTER — Other Ambulatory Visit: Payer: PPO | Admitting: Radiology

## 2021-10-24 ENCOUNTER — Ambulatory Visit: Payer: PPO

## 2021-10-24 DIAGNOSIS — N950 Postmenopausal bleeding: Secondary | ICD-10-CM

## 2021-10-25 DIAGNOSIS — M25552 Pain in left hip: Secondary | ICD-10-CM | POA: Diagnosis not present

## 2021-10-25 NOTE — Progress Notes (Signed)
Please call patient with results- prefers phone communication.  Normal gyn u/s no thickening of the endometrial lining. Use patch consistently to avoid breakthrough bleeding. Return to office if bleeding reoccurs.

## 2021-12-07 DIAGNOSIS — M25552 Pain in left hip: Secondary | ICD-10-CM | POA: Diagnosis not present

## 2021-12-07 DIAGNOSIS — M25551 Pain in right hip: Secondary | ICD-10-CM | POA: Diagnosis not present

## 2022-01-11 ENCOUNTER — Ambulatory Visit: Payer: Self-pay

## 2022-01-15 ENCOUNTER — Encounter: Payer: Self-pay | Admitting: *Deleted

## 2022-01-17 ENCOUNTER — Telehealth: Payer: Self-pay

## 2022-01-17 NOTE — Telephone Encounter (Signed)
Patient came into the office to schedule appointment with Dr. Nehemiah Massed. At this time Dr. Nehemiah Massed does not have any openings until 2024. Patient was offered appointment with Dr. Laurence Ferrari, tomorrow 01/18/22 at 2:50 but patient declined. Patient states she will be calling Dr. Alveria Apley wife. aw

## 2022-01-24 ENCOUNTER — Ambulatory Visit: Payer: PPO | Admitting: Dermatology

## 2022-01-24 DIAGNOSIS — L578 Other skin changes due to chronic exposure to nonionizing radiation: Secondary | ICD-10-CM | POA: Diagnosis not present

## 2022-01-24 DIAGNOSIS — L820 Inflamed seborrheic keratosis: Secondary | ICD-10-CM

## 2022-01-24 DIAGNOSIS — L821 Other seborrheic keratosis: Secondary | ICD-10-CM | POA: Diagnosis not present

## 2022-01-24 NOTE — Progress Notes (Signed)
   Follow-Up Visit   Subjective  Kimberly Leon is a 76 y.o. female who presents for the following: Irregular skin lesion (On the L cheek -present for a few weeks, crusting). The patient has spots, moles and lesions to be evaluated, some may be new or changing and the patient has concerns that these could be cancer.  The following portions of the chart were reviewed this encounter and updated as appropriate:   Tobacco  Allergies  Meds  Problems  Med Hx  Surg Hx  Fam Hx     Review of Systems:  No other skin or systemic complaints except as noted in HPI or Assessment and Plan.  Objective  Well appearing patient in no apparent distress; mood and affect are within normal limits.  A focused examination was performed including the face. Relevant physical exam findings are noted in the Assessment and Plan.  L cheek x 1 Erythematous stuck-on, waxy papule or plaque   Assessment & Plan  Inflamed seborrheic keratosis L cheek x 1 Symptomatic, irritating, patient would like treated.  Destruction of lesion - L cheek x 1 Complexity: simple   Destruction method: cryotherapy   Informed consent: discussed and consent obtained   Timeout:  patient name, date of birth, surgical site, and procedure verified Lesion destroyed using liquid nitrogen: Yes   Region frozen until ice ball extended beyond lesion: Yes   Outcome: patient tolerated procedure well with no complications   Post-procedure details: wound care instructions given    Actinic Damage - chronic, secondary to cumulative UV radiation exposure/sun exposure over time - diffuse scaly erythematous macules with underlying dyspigmentation - Recommend daily broad spectrum sunscreen SPF 30+ to sun-exposed areas, reapply every 2 hours as needed.  - Recommend staying in the shade or wearing long sleeves, sun glasses (UVA+UVB protection) and wide brim hats (4-inch brim around the entire circumference of the hat). - Call for new or changing  lesions.  Seborrheic Keratoses - Stuck-on, waxy, tan-brown papules and/or plaques  - Benign-appearing - Discussed benign etiology and prognosis. - Observe - Call for any changes  Return for appointment as scheduled; cancel December appt.  Luther Redo, CMA, am acting as scribe for Sarina Ser, MD . Documentation: I have reviewed the above documentation for accuracy and completeness, and I agree with the above.  Sarina Ser, MD

## 2022-01-24 NOTE — Patient Instructions (Signed)
Due to recent changes in healthcare laws, you may see results of your pathology and/or laboratory studies on MyChart before the doctors have had a chance to review them. We understand that in some cases there may be results that are confusing or concerning to you. Please understand that not all results are received at the same time and often the doctors may need to interpret multiple results in order to provide you with the best plan of care or course of treatment. Therefore, we ask that you please give us 2 business days to thoroughly review all your results before contacting the office for clarification. Should we see a critical lab result, you will be contacted sooner.   If You Need Anything After Your Visit  If you have any questions or concerns for your doctor, please call our main line at [phone] and press option 4 to reach your doctor's medical assistant. If no one answers, please leave a voicemail as directed and we will return your call as soon as possible. Messages left after 4 pm will be answered the following business day.   You may also send us a message via MyChart. We typically respond to MyChart messages within 1-2 business days.  For prescription refills, please ask your pharmacy to contact our office. Our fax number is [phone].  If you have an urgent issue when the clinic is closed that cannot wait until the next business day, you can page your doctor at the number below.    Please note that while we do our best to be available for urgent issues outside of office hours, we are not available 24/7.   If you have an urgent issue and are unable to reach us, you may choose to seek medical care at your doctor's office, retail clinic, urgent care center, or emergency room.  If you have a medical emergency, please immediately call 911 or go to the emergency department.  Pager Numbers  - Dr. Kowalski: [phone]  - Dr. Moye: [phone]  - Dr. Stewart:  [phone]  In the event of inclement weather, please call our main line at [phone] for an update on the status of any delays or closures.  Dermatology Medication Tips: Please keep the boxes that topical medications come in in order to help keep track of the instructions about where and how to use these. Pharmacies typically print the medication instructions only on the boxes and not directly on the medication tubes.   If your medication is too expensive, please contact our office at [phone] option 4 or send us a message through MyChart.   We are unable to tell what your co-pay for medications will be in advance as this is different depending on your insurance coverage. However, we may be able to find a substitute medication at lower cost or fill out paperwork to get insurance to cover a needed medication.   If a prior authorization is required to get your medication covered by your insurance company, please allow us 1-2 business days to complete this process.  Drug prices often vary depending on where the prescription is filled and some pharmacies may offer cheaper prices.  The website www.goodrx.com contains coupons for medications through different pharmacies. The prices here do not account for what the cost may be with help from insurance (it may be cheaper with your insurance), but the website can give you the price if you did not use any insurance.  - You can print the associated coupon and take it with   your prescription to the pharmacy.  - You may also stop by our office during regular business hours and pick up a GoodRx coupon card.  - If you need your prescription sent electronically to a different pharmacy, notify our office through [place] MyChart or by phone at [phone] option 4.     Si Usted Necesita Algo Despus de Su Visita  Tambin puede enviarnos un mensaje a travs de MyChart. Por lo general respondemos a los mensajes de MyChart en el transcurso de 1 a 2  das hbiles.  Para renovar recetas, por favor pida a su farmacia que se ponga en contacto con nuestra oficina. Nuestro nmero de fax es el [phone].  Si tiene un asunto urgente cuando la clnica est cerrada y que no puede esperar hasta el siguiente da hbil, puede llamar/localizar a su doctor(a) al nmero que aparece a continuacin.   Por favor, tenga en cuenta que aunque hacemos todo lo posible para estar disponibles para asuntos urgentes fuera del horario de oficina, no estamos disponibles las 24 horas del da, los 7 das de la semana.   Si tiene un problema urgente y no puede comunicarse con nosotros, puede optar por buscar atencin mdica  en el consultorio de su doctor(a), en una clnica privada, en un centro de atencin urgente o en una sala de emergencias.  Si tiene una emergencia mdica, por favor llame inmediatamente al 911 o vaya a la sala de emergencias.  Nmeros de bper  - Dr. Kowalski: [phone]  - Dra. Moye: [phone]  - Dra. Stewart: [phone]  En caso de inclemencias del tiempo, por favor llame a nuestra lnea principal al [phone] para una actualizacin sobre el estado de cualquier retraso o cierre.  Consejos para la medicacin en dermatologa: Por favor, guarde las cajas en las que vienen los medicamentos de uso tpico para ayudarle a seguir las instrucciones sobre dnde y cmo usarlos. Las farmacias generalmente imprimen las instrucciones del medicamento slo en las cajas y no directamente en los tubos del medicamento.   Si su medicamento es muy caro, por favor, pngase en contacto con nuestra oficina llamando al [phone] y presione la opcin 4 o envenos un mensaje a travs de MyChart.   No podemos decirle cul ser su copago por los medicamentos por adelantado ya que esto es diferente dependiendo de la cobertura de su seguro. Sin embargo, es posible que podamos encontrar un medicamento sustituto a menor costo o llenar un formulario para que el  seguro cubra el medicamento que se considera necesario.   Si se requiere una autorizacin previa para que su compaa de seguros cubra su medicamento, por favor permtanos de 1 a 2 das hbiles para completar este proceso.  Los precios de los medicamentos varan con frecuencia dependiendo del lugar de dnde se surte la receta y alguna farmacias pueden ofrecer precios ms baratos.  El sitio web www.goodrx.com tiene cupones para medicamentos de diferentes farmacias. Los precios aqu no tienen en cuenta lo que podra costar con la ayuda del seguro (puede ser ms barato con su seguro), pero el sitio web puede darle el precio si no utiliz ningn seguro.  - Puede imprimir el cupn correspondiente y llevarlo con su receta a la farmacia.  - Tambin puede pasar por nuestra oficina durante el horario de atencin regular y recoger una tarjeta de cupones de GoodRx.  - Si necesita que su receta se enve electrnicamente a una farmacia diferente, informe a nuestra oficina a travs de MyChart de [place]   o por telfono llamando al [phone] y presione la opcin 4.  

## 2022-02-06 ENCOUNTER — Ambulatory Visit: Payer: PPO | Admitting: Podiatry

## 2022-02-06 DIAGNOSIS — M955 Acquired deformity of pelvis: Secondary | ICD-10-CM | POA: Diagnosis not present

## 2022-02-06 DIAGNOSIS — M9903 Segmental and somatic dysfunction of lumbar region: Secondary | ICD-10-CM | POA: Diagnosis not present

## 2022-02-06 DIAGNOSIS — Z91199 Patient's noncompliance with other medical treatment and regimen due to unspecified reason: Secondary | ICD-10-CM

## 2022-02-06 DIAGNOSIS — M9905 Segmental and somatic dysfunction of pelvic region: Secondary | ICD-10-CM | POA: Diagnosis not present

## 2022-02-06 DIAGNOSIS — M5114 Intervertebral disc disorders with radiculopathy, thoracic region: Secondary | ICD-10-CM | POA: Diagnosis not present

## 2022-02-06 NOTE — Progress Notes (Signed)
   Complete physical exam  Patient: Kimberly Leon   DOB: 01/13/1999   76 y.o. Female  MRN: [id number]  Subjective:    No chief complaint on file.   Kimberly Leon is a 76 y.o. female who presents today for a complete physical exam. She reports consuming a {diet types:17450} diet. {types:19826} She generally feels {DESC; WELL/FAIRLY WELL/POORLY:18703}. She reports sleeping {DESC; WELL/FAIRLY WELL/POORLY:18703}. She {does/does not:200015} have additional problems to discuss today.    Most recent fall risk assessment:    09/20/2021   10:42 AM  Fall Risk   Falls in the past year? 0  Number falls in past yr: 0  Injury with Fall? 0  Risk for fall due to : No Fall Risks  Follow up Falls evaluation completed     Most recent depression screenings:    09/20/2021   10:42 AM 08/11/2020   10:46 AM  PHQ 2/9 Scores  PHQ - 2 Score 0 0  PHQ- 9 Score 5     {VISON DENTAL STD PSA (Optional):27386}  {History (Optional):23778}  Patient Care Team: Jessup, Joy, NP as PCP - General (Nurse Practitioner)   Outpatient Medications Prior to Visit  Medication Sig   fluticasone (FLONASE) 50 MCG/ACT nasal spray Place 2 sprays into both nostrils in the morning and at bedtime. After 7 days, reduce to once daily.   norgestimate-ethinyl estradiol (SPRINTEC 28) 0.25-35 MG-MCG tablet Take 1 tablet by mouth daily.   Nystatin POWD Apply liberally to affected area 2 times per day   spironolactone (ALDACTONE) 100 MG tablet Take 1 tablet (100 mg total) by mouth daily.   No facility-administered medications prior to visit.    ROS        Objective:     There were no vitals taken for this visit. {Vitals History (Optional):23777}  Physical Exam   No results found for any visits on 10/26/21. {Show previous labs (optional):23779}    Assessment & Plan:    Routine Health Maintenance and Physical Exam  Immunization History  Administered Date(s) Administered   DTaP 03/29/1999, 05/25/1999,  08/03/1999, 04/18/2000, 11/02/2003   Hepatitis A 08/29/2007, 09/03/2008   Hepatitis B 01/14/1999, 02/21/1999, 08/03/1999   HiB (PRP-OMP) 03/29/1999, 05/25/1999, 08/03/1999, 04/18/2000   IPV 03/29/1999, 05/25/1999, 01/22/2000, 11/02/2003   Influenza,inj,Quad PF,6+ Mos 12/04/2013   Influenza-Unspecified 03/05/2012   MMR 01/21/2001, 11/02/2003   Meningococcal Polysaccharide 09/03/2011   Pneumococcal Conjugate-13 04/18/2000   Pneumococcal-Unspecified 08/03/1999, 10/17/1999   Tdap 09/03/2011   Varicella 01/22/2000, 08/29/2007    Health Maintenance  Topic Date Due   HIV Screening  Never done   Hepatitis C Screening  Never done   INFLUENZA VACCINE  10/24/2021   PAP-Cervical Cytology Screening  10/26/2021 (Originally 01/13/2020)   PAP SMEAR-Modifier  10/26/2021 (Originally 01/13/2020)   TETANUS/TDAP  10/26/2021 (Originally 09/02/2021)   HPV VACCINES  Discontinued   COVID-19 Vaccine  Discontinued    Discussed health benefits of physical activity, and encouraged her to engage in regular exercise appropriate for her age and condition.  Problem List Items Addressed This Visit   None Visit Diagnoses     Annual physical exam    -  Primary   Cervical cancer screening       Need for Tdap vaccination          No follow-ups on file.     Joy Jessup, NP   

## 2022-02-08 ENCOUNTER — Encounter: Payer: Self-pay | Admitting: Dermatology

## 2022-02-09 DIAGNOSIS — M9903 Segmental and somatic dysfunction of lumbar region: Secondary | ICD-10-CM | POA: Diagnosis not present

## 2022-02-09 DIAGNOSIS — M9905 Segmental and somatic dysfunction of pelvic region: Secondary | ICD-10-CM | POA: Diagnosis not present

## 2022-02-09 DIAGNOSIS — M5114 Intervertebral disc disorders with radiculopathy, thoracic region: Secondary | ICD-10-CM | POA: Diagnosis not present

## 2022-02-09 DIAGNOSIS — M955 Acquired deformity of pelvis: Secondary | ICD-10-CM | POA: Diagnosis not present

## 2022-02-12 DIAGNOSIS — M5431 Sciatica, right side: Secondary | ICD-10-CM | POA: Diagnosis not present

## 2022-02-12 DIAGNOSIS — M48061 Spinal stenosis, lumbar region without neurogenic claudication: Secondary | ICD-10-CM | POA: Diagnosis not present

## 2022-02-20 ENCOUNTER — Other Ambulatory Visit: Payer: Self-pay | Admitting: Orthopedic Surgery

## 2022-02-20 DIAGNOSIS — M4807 Spinal stenosis, lumbosacral region: Secondary | ICD-10-CM

## 2022-02-26 ENCOUNTER — Ambulatory Visit
Admission: RE | Admit: 2022-02-26 | Discharge: 2022-02-26 | Disposition: A | Discharge status: Home / Self Care | Payer: PPO | Source: Ambulatory Visit | Attending: Orthopedic Surgery | Admitting: Orthopedic Surgery

## 2022-02-26 DIAGNOSIS — M4807 Spinal stenosis, lumbosacral region: Secondary | ICD-10-CM | POA: Diagnosis not present

## 2022-02-26 DIAGNOSIS — M48061 Spinal stenosis, lumbar region without neurogenic claudication: Secondary | ICD-10-CM | POA: Diagnosis not present

## 2022-03-12 ENCOUNTER — Ambulatory Visit: Payer: PPO | Admitting: Dermatology

## 2022-03-14 ENCOUNTER — Ambulatory Visit
Admission: RE | Admit: 2022-03-14 | Discharge: 2022-03-14 | Disposition: A | Discharge status: Home / Self Care | Payer: PPO | Source: Ambulatory Visit | Attending: Emergency Medicine | Admitting: Emergency Medicine

## 2022-03-14 ENCOUNTER — Other Ambulatory Visit: Payer: Self-pay | Admitting: Nurse Practitioner

## 2022-03-14 VITALS — BP 127/80 | HR 74 | Temp 97.9°F | Resp 18 | Ht 62.0 in | Wt 141.0 lb

## 2022-03-14 DIAGNOSIS — Z79890 Hormone replacement therapy (postmenopausal): Secondary | ICD-10-CM

## 2022-03-14 DIAGNOSIS — J069 Acute upper respiratory infection, unspecified: Secondary | ICD-10-CM | POA: Diagnosis not present

## 2022-03-14 MED ORDER — BENZONATATE 100 MG PO CAPS: 100.0000 mg | ORAL_CAPSULE | Freq: Three times a day (TID) | ORAL | 0 refills | Status: DC | PRN

## 2022-03-14 MED ORDER — PREDNISONE 10 MG (21) PO TBPK: ORAL_TABLET | Freq: Every day | ORAL | 0 refills | Status: DC

## 2022-03-14 NOTE — Discharge Instructions (Addendum)
Take the prednisone and Tessalon Perles as directed.  Follow up with your primary care provider if your symptoms are not improving.

## 2022-03-14 NOTE — ED Triage Notes (Signed)
Patient to Urgent Care with complaints of cough that started Sunday. Reports yesterday she had some green mucus. Denies any known fevers.   Has not taken otc medication.

## 2022-03-14 NOTE — ED Provider Notes (Signed)
Roderic Palau    CSN: [id number] Arrival date & time: 03/14/22  1146      History   Chief Complaint Chief Complaint  Patient presents with   Cough    Entered by patient    HPI Kimberly Leon is a 76 y.o. female.  Patient presents with 3 day history of congestion and cough.  She reports green nasal mucous.  No fever, chest pain, shortness of breath, or other symptoms.  No treatment at home.  Her medical history includes allergic rhinitis, GERD, arthritis.    The history is provided by the patient and medical records.    Past Medical History:  Diagnosis Date   Allergic rhinitis    Arthritis    Basal cell carcinoma 05/10/2019   forehead    Cancer ([place])    skin ca-basal cell   Endometrial polyp    Family history of adverse reaction to anesthesia    mother-- ponv   GERD (gastroesophageal reflux disease)    Sinus congestion    Wears contact lenses    not currently (03/16/21)    Patient Active Problem List   Diagnosis Date Noted   HIP PAIN, RIGHT 01/17/2010   SCIATICA, RIGHT 01/17/2010    Past Surgical History:  Procedure Laterality Date   APPENDECTOMY  1967   BLEPHAROPLASTY Bilateral 04/2016   upper eyelid   CHEILECTOMY Left 03/30/2021   Procedure: CHEILECTOMY;  Surgeon: Caroline More, DPM;  Location: [place];  Service: Podiatry;  Laterality: Left;   COLONOSCOPY  2014   COMBINED HYSTEROSCOPY DIAGNOSTIC / D&C  08-24-2004   dr Cherylann Banas [facility]   DILATATION & CURETTAGE/HYSTEROSCOPY WITH MYOSURE N/A 06/03/2017   Procedure: DILATATION & CURETTAGE/HYSTEROSCOPY WITH MYOSURE RESECTION OF ENDOMETRIAL POLYP;  Surgeon: Anastasio Auerbach, MD;  Location: [place];  Service: Gynecology;  Laterality: N/A;  request to follow 7:30am case inf [facility] block time requests one hour   EXCISION MORTON'S NEUROMA Right 2016   FOOT SURGERY Bilateral 03/1998   removal bone spurs   KNEE ARTHROSCOPY Right 05/05/2020   Procedure: ARTHROSCOPY KNEE;   Surgeon: Hessie Knows, MD;  Location: [facility];  Service: Orthopedics;  Laterality: Right;   MOHS SURGERY     ROTATOR CUFF REPAIR Right 2010   TUBAL LIGATION  yrs ago    OB History     Gravida  2   Para  2   Term  2   Preterm      AB      Living  2      SAB      IAB      Ectopic      Multiple      Live Births  2            Home Medications    Prior to Admission medications   Medication Sig Start Date End Date Taking? Authorizing Provider  benzonatate (TESSALON) 100 MG capsule Take 1 capsule (100 mg total) by mouth 3 (three) times daily as needed for cough. 03/14/22  Yes Sharion Balloon, NP  predniSONE (STERAPRED UNI-PAK 21 TAB) 10 MG (21) TBPK tablet Take by mouth daily. As directed 03/14/22  Yes Sharion Balloon, NP  cetirizine (ZYRTEC) 10 MG tablet Take 10 mg by mouth every morning.    [provider]  Cyanocobalamin (B-12 PO) Take 1 tablet by mouth daily.    [provider]  estradiol-levonorgestrel (CLIMARA PRO) 0.045-0.015 MG/DAY Place 1 patch onto the skin once  a week. 02/21/21   Marny Lowenstein A, NP  hydrocortisone 2.5 % lotion APPLY TO AFFECTED AREAS EVERY OTHER DAY AS NEEDED Patient not taking: Reported on 01/24/2022 06/02/20   Ralene Bathe, MD  meloxicam (MOBIC) 15 MG tablet Take by mouth. 07/10/21   [provider]  omeprazole (PRILOSEC) 20 MG capsule Take by mouth. 06/29/21   [provider]  traZODone (DESYREL) 150 MG tablet Take 150 mg by mouth at bedtime.    [provider]  VITAMIN D PO Take 1 capsule by mouth daily.    [provider]    Family History Family History  Problem Relation Age of Onset   Hypertension Mother    Diabetes Mother    Heart disease Mother    Heart disease Father    Breast cancer Maternal Aunt        Age 70's    Social History Social History   Tobacco Use   Smoking status: Never   Smokeless tobacco: Never  Vaping Use   Vaping Use: Never used  Substance  Use Topics   Alcohol use: Yes    Alcohol/week: 7.0 standard drinks of alcohol    Types: 7 Glasses of wine per week   Drug use: No     Allergies   Patient has no known allergies.   Review of Systems Review of Systems  Constitutional:  Negative for chills and fever.  HENT:  Positive for congestion. Negative for ear pain and sore throat.   Respiratory:  Positive for cough. Negative for shortness of breath.   Cardiovascular:  Negative for chest pain and palpitations.  Gastrointestinal:  Negative for diarrhea and vomiting.  Skin:  Negative for color change and rash.  All other systems reviewed and are negative.    Physical Exam Triage Vital Signs ED Triage Vitals  Enc Vitals Group     BP 03/14/22 1223 127/80     Pulse Rate 03/14/22 1213 74     Resp 03/14/22 1213 18     Temp 03/14/22 1213 97.9 F (36.6 C)     Temp src --      SpO2 03/14/22 1213 97 %     Weight 03/14/22 1222 141 lb (64 kg)     Height 03/14/22 1222 '5\' 2"'$  (1.575 m)     Head Circumference --      Peak Flow --      Pain Score 03/14/22 1222 0     Pain Loc --      Pain Edu? --      Excl. in [place]? --    No data found.  Updated Vital Signs BP 127/80   Pulse 74   Temp 97.9 F (36.6 C)   Resp 18   Ht '5\' 2"'$  (1.575 m)   Wt 141 lb (64 kg)   SpO2 97%   BMI 25.79 kg/m   Visual Acuity Right Eye Distance:   Left Eye Distance:   Bilateral Distance:    Right Eye Near:   Left Eye Near:    Bilateral Near:     Physical Exam Vitals and nursing note reviewed.  Constitutional:      General: She is not in acute distress.    Appearance: Normal appearance. She is well-developed. She is not ill-appearing.  HENT:     Right Ear: Tympanic membrane normal.     Left Ear: Tympanic membrane normal.     Nose: Nose normal.     Mouth/Throat:     Mouth: Mucous  membranes are moist.     Pharynx: Oropharynx is clear.  Cardiovascular:     Rate and Rhythm: Normal rate and regular rhythm.     Heart sounds: Normal heart  sounds.  Pulmonary:     Effort: Pulmonary effort is normal. No respiratory distress.     Breath sounds: Normal breath sounds.  Musculoskeletal:     Cervical back: Neck supple.  Skin:    General: Skin is warm and dry.  Neurological:     Mental Status: She is alert.  Psychiatric:        Mood and Affect: Mood normal.        Behavior: Behavior normal.      UC Treatments / Results  Labs (all labs ordered are listed, but only abnormal results are displayed) Labs Reviewed - No data to display  EKG   Radiology No results found.  Procedures Procedures (including critical care time)  Medications Ordered in UC Medications - No data to display  Initial Impression / Assessment and Plan / UC Course  I have reviewed the triage vital signs and the nursing notes.  Pertinent labs & imaging results that were available during my care of the patient were reviewed by me and considered in my medical decision making (see chart for details).    Viral URI.  Patient declines COVID or flu test.  Treating with prednisone and Tessalon Perles.  Discussed symptomatic treatment including Tylenol or ibuprofen, rest, hydration.  Instructed patient to follow up with her PCP if symptoms are not improving.  She agrees to plan of care.   Final Clinical Impressions(s) / UC Diagnoses   Final diagnoses:  Viral URI     Discharge Instructions      Take the prednisone and Tessalon Perles as directed.  Follow up with your primary care provider if your symptoms are not improving.        ED Prescriptions     Medication Sig Dispense Auth. Provider   benzonatate (TESSALON) 100 MG capsule Take 1 capsule (100 mg total) by mouth 3 (three) times daily as needed for cough. 21 capsule Sharion Balloon, NP   predniSONE (STERAPRED UNI-PAK 21 TAB) 10 MG (21) TBPK tablet Take by mouth daily. As directed 21 tablet Sharion Balloon, NP      PDMP not reviewed this encounter.   Sharion Balloon, NP 03/14/22 1242

## 2022-03-15 NOTE — Telephone Encounter (Signed)
Last annual exam 01/2021 No exam scheduled

## 2022-03-23 ENCOUNTER — Ambulatory Visit
Admission: RE | Admit: 2022-03-23 | Discharge: 2022-03-23 | Disposition: A | Discharge status: Home / Self Care | Payer: PPO | Source: Ambulatory Visit

## 2022-03-23 VITALS — BP 137/72 | HR 68 | Temp 98.7°F | Resp 19 | Ht 62.0 in | Wt 141.0 lb

## 2022-03-23 DIAGNOSIS — J029 Acute pharyngitis, unspecified: Secondary | ICD-10-CM | POA: Diagnosis not present

## 2022-03-23 LAB — POCT RAPID STREP A (OFFICE): Rapid Strep A Screen: NEGATIVE

## 2022-03-23 NOTE — ED Triage Notes (Signed)
Patient to Urgent Care with complaints of sore throat. Pain started 12/17. Reports pain has decreased some today. Denies any known fevers.   Reports she was recently seen for her cough which has now subsided.

## 2022-03-23 NOTE — ED Provider Notes (Signed)
Roderic Palau    CSN: [id number] Arrival date & time: 03/23/22  1146      History   Chief Complaint Chief Complaint  Patient presents with   Sore Throat    Entered by patient    HPI Kimberly Leon is a 76 y.o. female.  Patient presents with 1-2 week history of sore throat.  She was seen here on 03/14/2022; diagnosed with viral URI; treated with Ladona Ridgel; Her congestion and cough improved but she now has sore throat. She would like to make sure she does not have strep.  She denies fever, rash, shortness of breath, or other symptoms.  No OTC medications taken at home.  The history is provided by the patient and medical records.    Past Medical History:  Diagnosis Date   Allergic rhinitis    Arthritis    Basal cell carcinoma 05/10/2019   forehead    Cancer ([place])    skin ca-basal cell   Endometrial polyp    Family history of adverse reaction to anesthesia    mother-- ponv   GERD (gastroesophageal reflux disease)    Sinus congestion    Wears contact lenses    not currently (03/16/21)    Patient Active Problem List   Diagnosis Date Noted   HIP PAIN, RIGHT 01/17/2010   SCIATICA, RIGHT 01/17/2010    Past Surgical History:  Procedure Laterality Date   APPENDECTOMY  1967   BLEPHAROPLASTY Bilateral 04/2016   upper eyelid   CHEILECTOMY Left 03/30/2021   Procedure: CHEILECTOMY;  Surgeon: Caroline More, DPM;  Location: [place];  Service: Podiatry;  Laterality: Left;   COLONOSCOPY  2014   COMBINED HYSTEROSCOPY DIAGNOSTIC / D&C  08-24-2004   dr Cherylann Banas [facility]   DILATATION & CURETTAGE/HYSTEROSCOPY WITH MYOSURE N/A 06/03/2017   Procedure: DILATATION & CURETTAGE/HYSTEROSCOPY WITH MYOSURE RESECTION OF ENDOMETRIAL POLYP;  Surgeon: Anastasio Auerbach, MD;  Location: [place];  Service: Gynecology;  Laterality: N/A;  request to follow 7:30am case inf [place] [facility] block time requests one hour   EXCISION MORTON'S NEUROMA Right 2016    FOOT SURGERY Bilateral 03/1998   removal bone spurs   KNEE ARTHROSCOPY Right 05/05/2020   Procedure: ARTHROSCOPY KNEE;  Surgeon: Hessie Knows, MD;  Location: [facility];  Service: Orthopedics;  Laterality: Right;   MOHS SURGERY     ROTATOR CUFF REPAIR Right 2010   TUBAL LIGATION  yrs ago    OB History     Gravida  2   Para  2   Term  2   Preterm      AB      Living  2      SAB      IAB      Ectopic      Multiple      Live Births  2            Home Medications    Prior to Admission medications   Medication Sig Start Date End Date Taking? Authorizing Provider  benzonatate (TESSALON) 100 MG capsule Take 1 capsule (100 mg total) by mouth 3 (three) times daily as needed for cough. 03/14/22   Sharion Balloon, NP  cetirizine (ZYRTEC) 10 MG tablet Take 10 mg by mouth every morning.    [provider]  citalopram (CELEXA) 40 MG tablet Take 40 mg by mouth daily.    [provider]  Cyanocobalamin (B-12 PO) Take 1 tablet by mouth daily.  [provider]  estradiol-levonorgestrel [facility] PRO) 0.045-0.015 MG/DAY PLACE 1 PATCH ONTO THE SKIN ONCE A WEEK 03/15/22   Marny Lowenstein A, NP  hydrocortisone 2.5 % lotion APPLY TO AFFECTED AREAS EVERY OTHER DAY AS NEEDED Patient not taking: Reported on 01/24/2022 06/02/20   Ralene Bathe, MD  meloxicam (MOBIC) 15 MG tablet Take by mouth. 07/10/21   [provider]  omeprazole (PRILOSEC) 20 MG capsule Take by mouth. 06/29/21   [provider]  predniSONE (STERAPRED UNI-PAK 21 TAB) 10 MG (21) TBPK tablet Take by mouth daily. As directed 03/14/22   Sharion Balloon, NP  traZODone (DESYREL) 150 MG tablet Take 150 mg by mouth at bedtime.    [provider]  VITAMIN D PO Take 1 capsule by mouth daily.    [provider]    Family History Family History  Problem Relation Age of Onset   Hypertension Mother    Diabetes Mother    Heart disease Mother    Heart disease Father     Breast cancer Maternal Aunt        Age 42's    Social History Social History   Tobacco Use   Smoking status: Never   Smokeless tobacco: Never  Vaping Use   Vaping Use: Never used  Substance Use Topics   Alcohol use: Yes    Alcohol/week: 7.0 standard drinks of alcohol    Types: 7 Glasses of wine per week   Drug use: No     Allergies   Patient has no known allergies.   Review of Systems Review of Systems  Constitutional:  Negative for chills and fever.  HENT:  Positive for sore throat. Negative for ear pain.   Respiratory:  Negative for cough and shortness of breath.   Cardiovascular:  Negative for chest pain and palpitations.  Skin:  Negative for color change and rash.  All other systems reviewed and are negative.    Physical Exam Triage Vital Signs ED Triage Vitals  Enc Vitals Group     BP      Pulse      Resp      Temp      Temp src      SpO2      Weight      Height      Head Circumference      Peak Flow      Pain Score      Pain Loc      Pain Edu?      Excl. in [place]?    No data found.  Updated Vital Signs BP 137/72   Pulse 68   Temp 98.7 F (37.1 C) (Oral)   Resp 19   Ht '5\' 2"'$  (1.575 m)   Wt 141 lb (64 kg)   SpO2 98%   BMI 25.79 kg/m   Visual Acuity Right Eye Distance:   Left Eye Distance:   Bilateral Distance:    Right Eye Near:   Left Eye Near:    Bilateral Near:     Physical Exam Vitals and nursing note reviewed.  Constitutional:      General: She is not in acute distress.    Appearance: Normal appearance. She is well-developed. She is not ill-appearing.  HENT:     Right Ear: Tympanic membrane normal.     Left Ear: Tympanic membrane normal.     Nose: Nose normal.     Mouth/Throat:     Mouth: Mucous membranes are moist.  Pharynx: Oropharynx is clear.  Cardiovascular:     Rate and Rhythm: Normal rate and regular rhythm.     Heart sounds: Normal heart sounds.  Pulmonary:     Effort: Pulmonary effort is normal. No  respiratory distress.     Breath sounds: Normal breath sounds.  Musculoskeletal:     Cervical back: Neck supple.  Skin:    General: Skin is warm and dry.  Neurological:     Mental Status: She is alert.  Psychiatric:        Mood and Affect: Mood normal.        Behavior: Behavior normal.      UC Treatments / Results  Labs (all labs ordered are listed, but only abnormal results are displayed) Labs Reviewed  POCT RAPID STREP A (OFFICE)    EKG   Radiology No results found.  Procedures Procedures (including critical care time)  Medications Ordered in UC Medications - No data to display  Initial Impression / Assessment and Plan / UC Course  I have reviewed the triage vital signs and the nursing notes.  Pertinent labs & imaging results that were available during my care of the patient were reviewed by me and considered in my medical decision making (see chart for details).    Sore throat.  Rapid strep negative.  Discussed symptomatic treatment including Tylenol as needed.  Instructed patient to follow up with her PCP if her symptoms are not improving.  She agrees to plan of care.    Final Clinical Impressions(s) / UC Diagnoses   Final diagnoses:  Sore throat     Discharge Instructions      The strep test is negative.    Take Tylenol as needed for fever or discomfort.    Follow-up with your primary care provider if your symptoms are not improving.         ED Prescriptions   None    PDMP not reviewed this encounter.   Sharion Balloon, NP 03/23/22 1233

## 2022-03-23 NOTE — Discharge Instructions (Addendum)
The strep test is negative.    Take Tylenol as needed for fever or discomfort.    Follow-up with your primary care provider if your symptoms are not improving.

## 2022-03-27 DIAGNOSIS — M5136 Other intervertebral disc degeneration, lumbar region: Secondary | ICD-10-CM | POA: Diagnosis not present

## 2022-03-27 DIAGNOSIS — M1611 Unilateral primary osteoarthritis, right hip: Secondary | ICD-10-CM | POA: Diagnosis not present

## 2022-03-27 DIAGNOSIS — M48061 Spinal stenosis, lumbar region without neurogenic claudication: Secondary | ICD-10-CM | POA: Diagnosis not present

## 2022-03-27 DIAGNOSIS — M5416 Radiculopathy, lumbar region: Secondary | ICD-10-CM | POA: Diagnosis not present

## 2022-03-28 DIAGNOSIS — M1611 Unilateral primary osteoarthritis, right hip: Secondary | ICD-10-CM | POA: Diagnosis not present

## 2022-04-02 DIAGNOSIS — Z1231 Encounter for screening mammogram for malignant neoplasm of breast: Secondary | ICD-10-CM | POA: Diagnosis not present

## 2022-04-24 DIAGNOSIS — M48061 Spinal stenosis, lumbar region without neurogenic claudication: Secondary | ICD-10-CM | POA: Diagnosis not present

## 2022-04-24 DIAGNOSIS — M5136 Other intervertebral disc degeneration, lumbar region: Secondary | ICD-10-CM | POA: Diagnosis not present

## 2022-04-24 DIAGNOSIS — M1611 Unilateral primary osteoarthritis, right hip: Secondary | ICD-10-CM | POA: Diagnosis not present

## 2022-04-29 IMAGING — MR MR KNEE*R* W/O CM
6 series · 40 of 40 positions shown · non-contrast
Comparison: None.

CLINICAL DATA: Right knee pain for 2 months.  No known injury.

EXAM:
MRI OF THE RIGHT KNEE WITHOUT CONTRAST
TECHNIQUE: Multiplanar, multisequence MR imaging of the knee was performed. No
intravenous contrast was administered.

[Series 3: T2 fat-sat · axial · 4.0mm · 0.53mm/px · z∈[-108,+62]mm · 7 of 35 slices shown (1 of 3)]
[im 1/35]
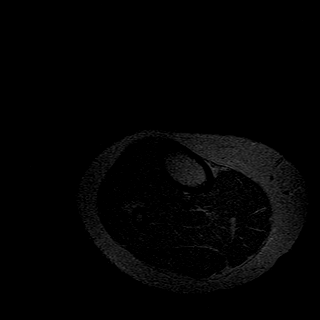
[im 6/35]
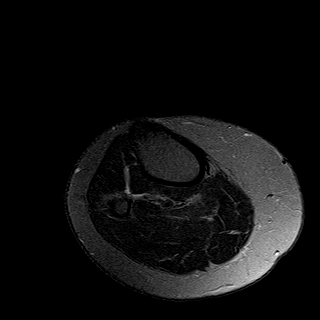
[im 12/35]
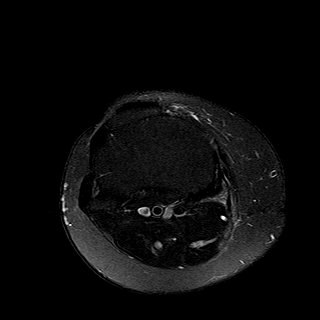
[im 18/35]
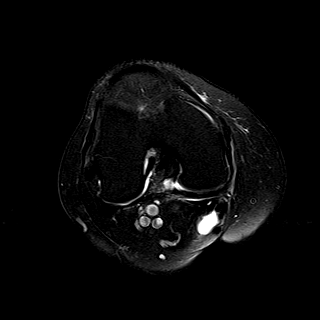
[im 23/35]
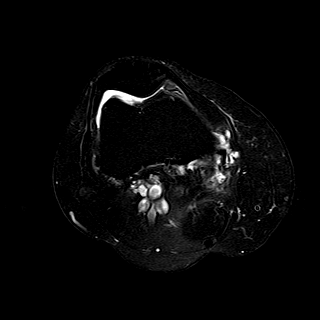
[im 29/35]
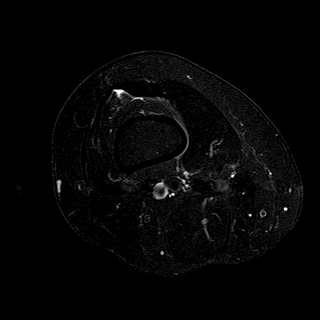
[im 35/35]
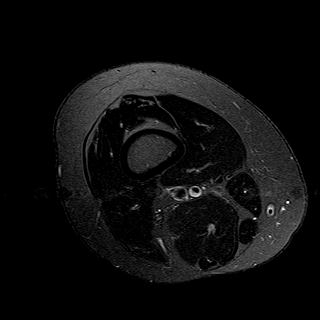

[Series 4: T1 · coronal · 4.0mm · 0.62mm/px · 5 of 27 slices shown]
[im 1/27]
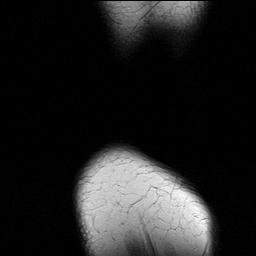
[im 7/27]
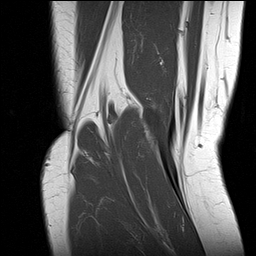
[im 14/27]
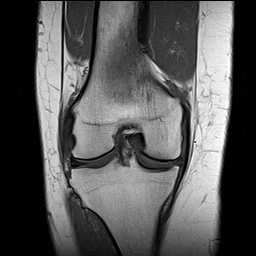
[im 20/27]
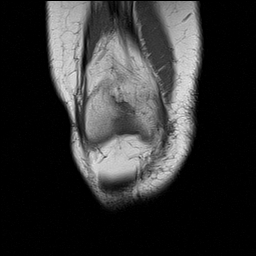
[im 27/27]
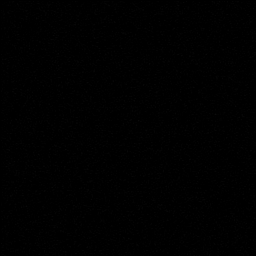

[Series 5: T2 fat-sat · coronal · 4.0mm · 0.62mm/px · 6 of 27 slices shown (2 of 3)]
[im 1/27]
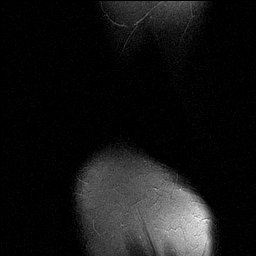
[im 6/27]
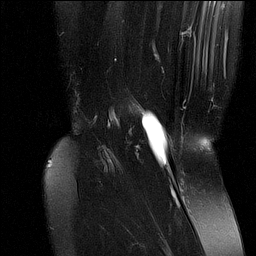
[im 11/27]
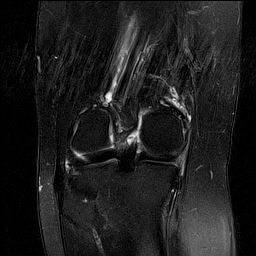
[im 16/27]
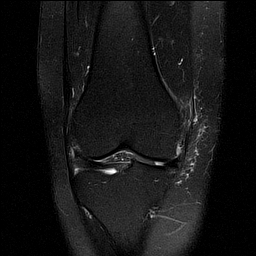
[im 21/27]
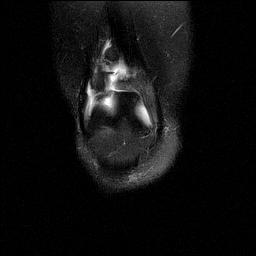
[im 27/27]
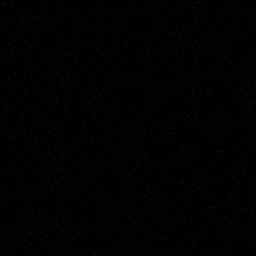

[Series 6: PD fat-sat · coronal · 4.0mm · 0.62mm/px · 6 of 27 slices shown (1 of 2)]
[im 1/27]
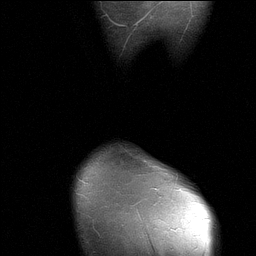
[im 6/27]
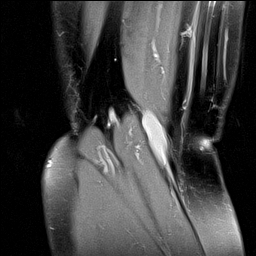
[im 11/27]
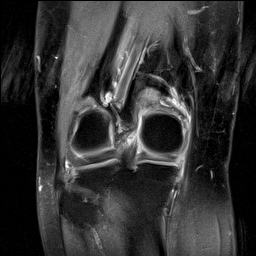
[im 16/27]
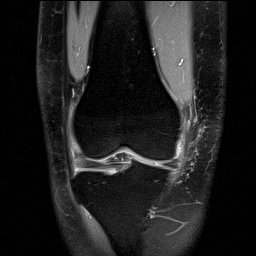
[im 21/27]
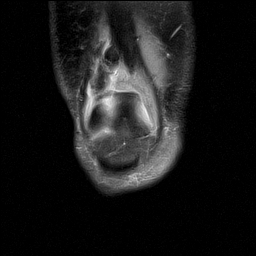
[im 27/27]
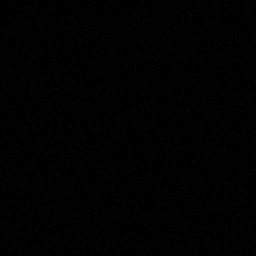

[Series 7: PD fat-sat · sagittal · 3.0mm · 0.62mm/px · 8 of 35 slices shown (2 of 2)]
[im 1/35]
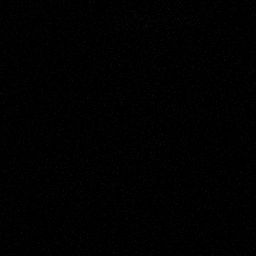
[im 5/35]
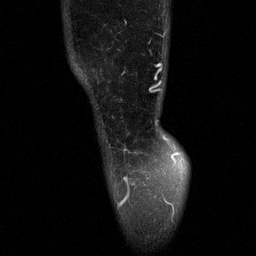
[im 10/35]
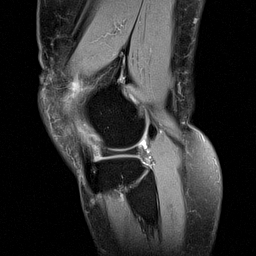
[im 15/35]
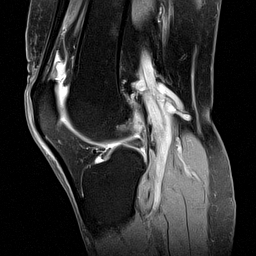
[im 20/35]
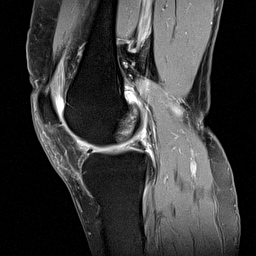
[im 25/35]
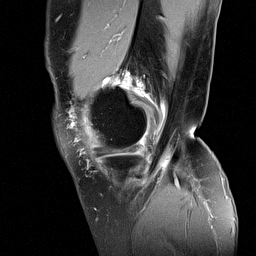
[im 30/35]
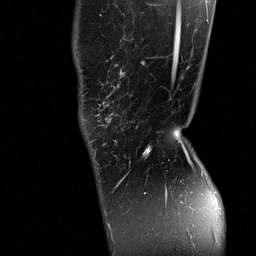
[im 35/35]
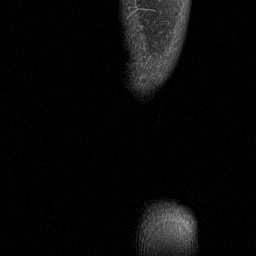

[Series 8: T2 fat-sat · sagittal · 3.0mm · 0.62mm/px · 8 of 35 slices shown (3 of 3)]
[im 1/35]
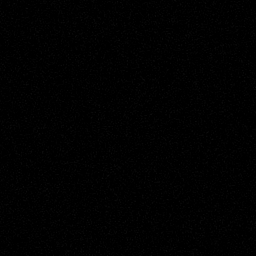
[im 5/35]
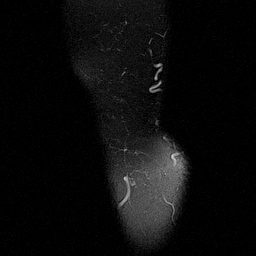
[im 10/35]
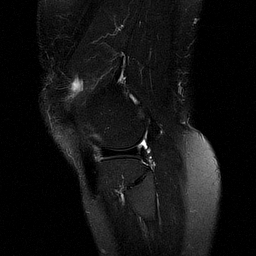
[im 15/35]
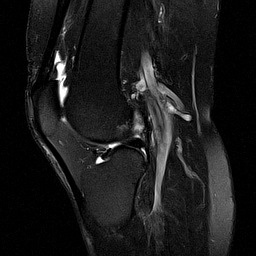
[im 20/35]
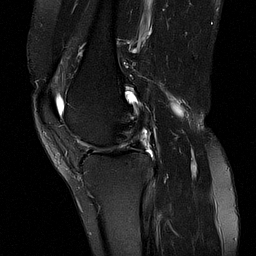
[im 25/35]
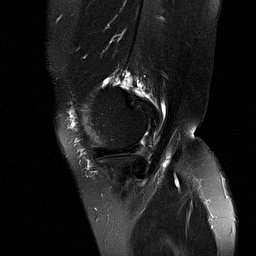
[im 30/35]
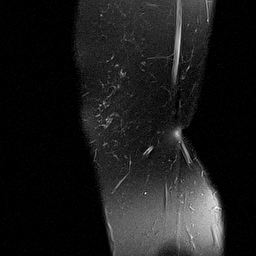
[im 35/35]
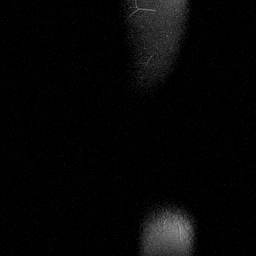

[40 of 40 positions shown; findings below may reference images not displayed]

FINDINGS: MENISCI

Medial meniscus: Intrasubstance degenerative signal is seen in the
posterior horn and body and there is some fraying along the free
edge. No focal tear is identified.

Lateral meniscus:  Intact.

LIGAMENTS

Cruciates:  Intact.

Collaterals:  Intact.

CARTILAGE

Patellofemoral:  Mildly degenerated.

Medial:  Mildly degenerated.

Lateral:  Normal.

Joint:  Small joint effusion.

Popliteal Fossa: Baker's cyst measures approximately 2.1 cm
transverse by 1 cm AP by 3.6 cm craniocaudal.

Extensor Mechanism:  Intact.

Bones: No fracture, stress change or worrisome lesion. Minimal
osteophytosis is seen about the knee.

Other: None.
IMPRESSION: Fraying along the free edge of posterior horn and body of the medial
meniscus with intrasubstance degenerative signal. No focal tear.

Mild osteoarthritis.

Small Baker's cyst.

## 2022-04-30 DIAGNOSIS — M5451 Vertebrogenic low back pain: Secondary | ICD-10-CM | POA: Diagnosis not present

## 2022-04-30 DIAGNOSIS — M25551 Pain in right hip: Secondary | ICD-10-CM | POA: Diagnosis not present

## 2022-04-30 DIAGNOSIS — M5459 Other low back pain: Secondary | ICD-10-CM | POA: Diagnosis not present

## 2022-04-30 DIAGNOSIS — M1611 Unilateral primary osteoarthritis, right hip: Secondary | ICD-10-CM | POA: Diagnosis not present

## 2022-05-02 DIAGNOSIS — M5451 Vertebrogenic low back pain: Secondary | ICD-10-CM | POA: Diagnosis not present

## 2022-05-02 DIAGNOSIS — M25551 Pain in right hip: Secondary | ICD-10-CM | POA: Diagnosis not present

## 2022-05-02 DIAGNOSIS — M1611 Unilateral primary osteoarthritis, right hip: Secondary | ICD-10-CM | POA: Diagnosis not present

## 2022-05-02 DIAGNOSIS — M5459 Other low back pain: Secondary | ICD-10-CM | POA: Diagnosis not present

## 2022-05-21 DIAGNOSIS — M1611 Unilateral primary osteoarthritis, right hip: Secondary | ICD-10-CM | POA: Diagnosis not present

## 2022-05-21 DIAGNOSIS — M5451 Vertebrogenic low back pain: Secondary | ICD-10-CM | POA: Diagnosis not present

## 2022-05-21 DIAGNOSIS — M25551 Pain in right hip: Secondary | ICD-10-CM | POA: Diagnosis not present

## 2022-05-21 DIAGNOSIS — M5459 Other low back pain: Secondary | ICD-10-CM | POA: Diagnosis not present

## 2022-05-23 DIAGNOSIS — M5451 Vertebrogenic low back pain: Secondary | ICD-10-CM | POA: Diagnosis not present

## 2022-05-23 DIAGNOSIS — M25551 Pain in right hip: Secondary | ICD-10-CM | POA: Diagnosis not present

## 2022-05-23 DIAGNOSIS — M1611 Unilateral primary osteoarthritis, right hip: Secondary | ICD-10-CM | POA: Diagnosis not present

## 2022-05-23 DIAGNOSIS — M5459 Other low back pain: Secondary | ICD-10-CM | POA: Diagnosis not present

## 2022-05-28 DIAGNOSIS — M5451 Vertebrogenic low back pain: Secondary | ICD-10-CM | POA: Diagnosis not present

## 2022-05-28 DIAGNOSIS — M25551 Pain in right hip: Secondary | ICD-10-CM | POA: Diagnosis not present

## 2022-05-28 DIAGNOSIS — M5459 Other low back pain: Secondary | ICD-10-CM | POA: Diagnosis not present

## 2022-05-28 DIAGNOSIS — M1611 Unilateral primary osteoarthritis, right hip: Secondary | ICD-10-CM | POA: Diagnosis not present

## 2022-05-31 DIAGNOSIS — M5459 Other low back pain: Secondary | ICD-10-CM | POA: Diagnosis not present

## 2022-05-31 DIAGNOSIS — M1611 Unilateral primary osteoarthritis, right hip: Secondary | ICD-10-CM | POA: Diagnosis not present

## 2022-05-31 DIAGNOSIS — M5451 Vertebrogenic low back pain: Secondary | ICD-10-CM | POA: Diagnosis not present

## 2022-05-31 DIAGNOSIS — M25551 Pain in right hip: Secondary | ICD-10-CM | POA: Diagnosis not present

## 2022-06-04 DIAGNOSIS — M5459 Other low back pain: Secondary | ICD-10-CM | POA: Diagnosis not present

## 2022-06-04 DIAGNOSIS — M25551 Pain in right hip: Secondary | ICD-10-CM | POA: Diagnosis not present

## 2022-06-04 DIAGNOSIS — M5451 Vertebrogenic low back pain: Secondary | ICD-10-CM | POA: Diagnosis not present

## 2022-06-04 DIAGNOSIS — M1611 Unilateral primary osteoarthritis, right hip: Secondary | ICD-10-CM | POA: Diagnosis not present

## 2022-06-06 DIAGNOSIS — M25551 Pain in right hip: Secondary | ICD-10-CM | POA: Diagnosis not present

## 2022-06-06 DIAGNOSIS — M5451 Vertebrogenic low back pain: Secondary | ICD-10-CM | POA: Diagnosis not present

## 2022-06-06 DIAGNOSIS — M1611 Unilateral primary osteoarthritis, right hip: Secondary | ICD-10-CM | POA: Diagnosis not present

## 2022-06-06 DIAGNOSIS — M5459 Other low back pain: Secondary | ICD-10-CM | POA: Diagnosis not present

## 2022-06-15 ENCOUNTER — Other Ambulatory Visit: Payer: Self-pay | Admitting: Nurse Practitioner

## 2022-06-15 DIAGNOSIS — Z79890 Hormone replacement therapy (postmenopausal): Secondary | ICD-10-CM

## 2022-06-15 NOTE — Telephone Encounter (Signed)
Med refill request: [facility] Last AEX: 02/21/21 Next AEX: n/a Last MMG (if hormonal med) 04/04/20 Refill authorized: Please Advise?

## 2022-06-18 NOTE — Telephone Encounter (Signed)
Will provide 3 month supply. Needs OV for med management to receive refills after this.

## 2022-06-26 DIAGNOSIS — M25551 Pain in right hip: Secondary | ICD-10-CM | POA: Diagnosis not present

## 2022-06-26 DIAGNOSIS — M1611 Unilateral primary osteoarthritis, right hip: Secondary | ICD-10-CM | POA: Diagnosis not present

## 2022-07-23 DIAGNOSIS — M1611 Unilateral primary osteoarthritis, right hip: Secondary | ICD-10-CM | POA: Diagnosis not present

## 2022-07-23 DIAGNOSIS — M25551 Pain in right hip: Secondary | ICD-10-CM | POA: Diagnosis not present

## 2022-07-27 ENCOUNTER — Other Ambulatory Visit: Payer: PPO

## 2022-07-29 NOTE — Discharge Instructions (Addendum)
Instructions after Total Hip Replacement     James P. Angie Fava., M.D.     Dept. of Orthopaedics & Sports Medicine  [facility]  [address]  [place], [place]  [address]  Phone: [phone]   Fax: [phone]    DIET: Drink plenty of non-alcoholic fluids. Resume your normal diet. Include foods high in fiber.  ACTIVITY:  You may use crutches or a walker with weight-bearing as tolerated, unless instructed otherwise. You may be weaned off of the walker or crutches by your Physical Therapist.  Do NOT reach below the level of your knees or cross your legs until allowed.    Continue doing gentle exercises. Exercising will reduce the pain and swelling, increase motion, and prevent muscle weakness.   Please continue to use the TED compression stockings for 6 weeks. You may remove the stockings at night, but should reapply them in the morning. Do not drive or operate any equipment until instructed.  WOUND CARE:  Continue to use ice packs periodically to reduce pain and swelling. Keep the incision clean and dry. You may bathe or shower after the staples are removed at the first office visit following surgery. Keep your Aquacel dressing on for 6 days after surgery.  Physical therapy should remove this and replace it with a honeycomb at home visits.  MEDICATIONS: You may resume your regular medications. Please take the pain medication as prescribed on the medication. Do not take pain medication on an empty stomach. You have been given a prescription for a blood thinner to prevent blood clots. Please take the medication as instructed. (NOTE: After completing a 2 week course of Lovenox, take two Enteric-coated aspirin once a day.) Pain medications and iron supplements can cause constipation. Use a stool softener (Senokot or Colace) on a daily basis and a laxative (dulcolax or miralax) as needed. Do not drive or drink alcoholic beverages when taking pain medications.  CALL THE  OFFICE FOR: Temperature above 101 degrees Excessive bleeding or drainage on the dressing. Excessive swelling, coldness, or paleness of the toes. Persistent nausea and vomiting.  FOLLOW-UP:  You should have an appointment to return to the office in 6 weeks after surgery. Arrangements have been made for continuation of Physical Therapy (either home therapy or outpatient therapy).     [facility] Department Directory         [email or web address]       [email or web address]          Cardiology  Appointments: [place] - [phone]  Endocrinology  Appointments: [place] [phone] [place] - [phone]  Gastroenterology  Appointments: [place] [phone] [place] - [phone]        General Surgery   Appointments: [facility]  Internal Medicine/Family Medicine  Appointments: [facility] [place] - [phone] [place] - [phone]  Metabolic and Weigh Loss Surgery  Appointments: [facility]        Neurology  Appointments: [place] [phone] [place] - [phone]  Neurosurgery  Appointments: [place]  Obstetrics & Gynecology  Appointments: [place] [phone] [place] - [phone]        Pediatrics  Appointments: [place] [phone] [place] - [phone]  Physiatry  Appointments: [place] [phone]  Physical Therapy  Appointments: [place] - [phone]        Podiatry  Appointments: [place] [phone] [place] - [phone]  Pulmonology  Appointments: [place]  Rheumatology  Appointments: [place]        [place]  Location: [facility]  [address] [place], [place]  [address]  [place] Location: [facility]. [address] [place], [place]  [address]  [place] Location: [facility] [address] [place], [place]  [address]

## 2022-07-30 ENCOUNTER — Encounter
Admission: RE | Admit: 2022-07-30 | Discharge: 2022-07-30 | Disposition: A | Discharge status: Home / Self Care | Payer: PPO | Source: Ambulatory Visit | Attending: Orthopedic Surgery | Admitting: Orthopedic Surgery

## 2022-07-30 VITALS — BP 128/77 | HR 63 | Resp 18 | Ht 62.0 in | Wt 144.4 lb

## 2022-07-30 DIAGNOSIS — Z01818 Encounter for other preprocedural examination: Secondary | ICD-10-CM | POA: Insufficient documentation

## 2022-07-30 DIAGNOSIS — M1611 Unilateral primary osteoarthritis, right hip: Secondary | ICD-10-CM | POA: Diagnosis not present

## 2022-07-30 DIAGNOSIS — Z01810 Encounter for preprocedural cardiovascular examination: Secondary | ICD-10-CM | POA: Diagnosis not present

## 2022-07-30 HISTORY — DX: Depression, unspecified: F32.A

## 2022-07-30 HISTORY — DX: Prediabetes: R73.03

## 2022-07-30 LAB — COMPREHENSIVE METABOLIC PANEL
ALT: 17 U/L (ref 0–44)
AST: 19 U/L (ref 15–41)
Albumin: 4.3 g/dL (ref 3.5–5.0)
Alkaline Phosphatase: 56 U/L (ref 38–126)
Anion gap: 8 (ref 5–15)
BUN: 16 mg/dL (ref 8–23)
CO2: 25 mmol/L (ref 22–32)
Calcium: 9.2 mg/dL (ref 8.9–10.3)
Chloride: 104 mmol/L (ref 98–111)
Creatinine, Ser: 0.65 mg/dL (ref 0.44–1.00)
GFR, Estimated: >60 mL/min (ref >60)
Glucose, Bld: 117 mg/dL — ABNORMAL HIGH (ref 70–99)
Potassium: 3.9 mmol/L (ref 3.5–5.1)
Sodium: 137 mmol/L (ref 135–145)
Total Bilirubin: 0.6 mg/dL (ref 0.3–1.2)
Total Protein: 6.7 g/dL (ref 6.5–8.1)

## 2022-07-30 LAB — URINALYSIS, ROUTINE W REFLEX MICROSCOPIC
Bilirubin Urine: NEGATIVE
Glucose, UA: NEGATIVE mg/dL
Hgb urine dipstick: NEGATIVE
Ketones, ur: NEGATIVE mg/dL
Leukocytes,Ua: NEGATIVE
Nitrite: NEGATIVE
Protein, ur: NEGATIVE mg/dL
Specific Gravity, Urine: 1.006 (ref 1.005–1.030)
pH: 7 (ref 5.0–8.0)

## 2022-07-30 LAB — SURGICAL PCR SCREEN
MRSA, PCR: NEGATIVE
Staphylococcus aureus: NEGATIVE

## 2022-07-30 LAB — CBC
HCT: 38.3 % (ref 36.0–46.0)
Hemoglobin: 13 g/dL (ref 12.0–15.0)
MCH: 30.5 pg (ref 26.0–34.0)
MCHC: 33.9 g/dL (ref 30.0–36.0)
MCV: 89.9 fL (ref 80.0–100.0)
Platelets: 272 10*3/uL (ref 150–400)
RBC: 4.26 MIL/uL (ref 3.87–5.11)
RDW: 12.8 % (ref 11.5–15.5)
WBC: 5.6 10*3/uL (ref 4.0–10.5)
nRBC: 0 % (ref 0.0–0.2)

## 2022-07-30 LAB — TYPE AND SCREEN
ABO/RH(D): O POS
Antibody Screen: NEGATIVE

## 2022-07-30 LAB — SEDIMENTATION RATE: Sed Rate: 6 mm/hr (ref 0–30)

## 2022-07-30 LAB — C-REACTIVE PROTEIN: CRP: 0.5 mg/dL (ref <1.0)

## 2022-07-30 NOTE — Patient Instructions (Addendum)
Your procedure is scheduled on:08-06-22 Monday Report to the Registration Desk on the 1st floor of the Medical Mall.Then proceed to the 2nd floor Surgery Desk To find out your arrival time, please call [phone] between 1PM - 3PM on:08-03-22 Friday If your arrival time is 6:00 am, do not arrive before that time as the Medical Mall entrance doors do not open until 6:00 am.  REMEMBER: Instructions that are not followed completely may result in serious medical risk, up to and including death; or upon the discretion of your surgeon and anesthesiologist your surgery may need to be rescheduled.  Do not eat food after midnight the night before surgery.  No gum chewing or hard candies.  You may however, drink CLEAR liquids up to 2 hours before you are scheduled to arrive for your surgery. Do not drink anything within 2 hours of your scheduled arrival time.  Clear liquids include: - water  - apple juice without pulp - gatorade (not RED colors) - black coffee or tea (Do NOT add milk or creamers to the coffee or tea) Do NOT drink anything that is not on this list.  In addition, your doctor has ordered for you to drink the provided:  Ensure Pre-Surgery Clear Carbohydrate Drink  Drinking this carbohydrate drink up to two hours before surgery helps to reduce insulin resistance and improve patient outcomes. Please complete drinking 2 hours before scheduled arrival time.  One week prior to surgery:Last dose will be today 07-30-22) Stop Anti-inflammatories (NSAIDS) such as meloxicam (MOBIC), Advil, Aleve, Ibuprofen, Motrin, Naproxen, Naprosyn and Aspirin based products such as Excedrin, Goody's Powder, BC Powder.You may however, take Tylenol if needed for pain up until the day of surgery. Stop ANY OVER THE COUNTER supplements/vitamins NOW (07-30-22) until after surgery (Vitamin D, Vitamin B12, Echinacea,Theracumin)  TAKE ONLY THESE MEDICATIONS THE MORNING OF SURGERY WITH A SIP OF WATER: -cetirizine  (ZYRTEC)  -citalopram (CELEXA)  -omeprazole (PRILOSEC)-take one the night before surgery and one the morning of surgery  No Alcohol for 24 hours before or after surgery.  No Smoking including e-cigarettes for 24 hours before surgery.  No chewable tobacco products for at least 6 hours before surgery.  No nicotine patches on the day of surgery.  Do not use any "recreational" drugs for at least a week (preferably 2 weeks) before your surgery.  Please be advised that the combination of cocaine and anesthesia may have negative outcomes, up to and including death. If you test positive for cocaine, your surgery will be cancelled.  On the morning of surgery brush your teeth with toothpaste and water, you may rinse your mouth with mouthwash if you wish. Do not swallow any toothpaste or mouthwash.  Use CHG Soap as directed on instruction sheet.  Do not wear jewelry, make-up, hairpins, clips or nail polish.  Do not wear lotions, powders, or perfumes.   Do not shave body hair from the neck down 48 hours before surgery.  Contact lenses, hearing aids and dentures may not be worn into surgery.  Do not bring valuables to the hospital. [facility] is not responsible for any missing/lost belongings or valuables.   Notify your doctor if there is any change in your medical condition (cold, fever, infection).  Wear comfortable clothing (specific to your surgery type) to the hospital.  After surgery, you can help prevent lung complications by doing breathing exercises.  Take deep breaths and cough every 1-2 hours. Your doctor may order a device called an Incentive Spirometer to help  you take deep breaths. When coughing or sneezing, hold a pillow firmly against your incision with both hands. This is called "splinting." Doing this helps protect your incision. It also decreases belly discomfort.  If you are being admitted to the hospital overnight, leave your suitcase in the car. After surgery it may be  brought to your room.  In case of increased patient census, it may be necessary for you, the patient, to continue your postoperative care in the Same Day Surgery department.  If you are being discharged the day of surgery, you will not be allowed to drive home. You will need a responsible individual to drive you home and stay with you for 24 hours after surgery.   If you are taking public transportation, you will need to have a responsible individual with you.  Please call the Pre-admissions Testing Dept. at [phone] if you have any questions about these instructions.  Surgery Visitation Policy:  Patients having surgery or a procedure may have two visitors.  Children under the age of 50 must have an adult with them who is not the patient.  Inpatient Visitation:    Visiting hours are 7 a.m. to 8 p.m. Up to four visitors are allowed at one time in a patient room. The visitors may rotate out with other people during the day.  One visitor age 26 or older may stay with the patient overnight and must be in the room by 8 p.m.  How to Use an Incentive Spirometer An incentive spirometer is a tool that measures how well you are filling your lungs with each breath. Learning to take long, deep breaths using this tool can help you keep your lungs clear and active. This may help to reverse or lessen your chance of developing breathing (pulmonary) problems, especially infection. You may be asked to use a spirometer: After a surgery. If you have a lung problem or a history of smoking. After a long period of time when you have been unable to move or be active. If the spirometer includes an indicator to show the highest number that you have reached, your health care provider or respiratory therapist will help you set a goal. Keep a log of your progress as told by your health care provider. What are the risks? Breathing too quickly may cause dizziness or cause you to pass out. Take your time so you  do not get dizzy or light-headed. If you are in pain, you may need to take pain medicine before doing incentive spirometry. It is harder to take a deep breath if you are having pain. How to use your incentive spirometer  Sit up on the edge of your bed or on a chair. Hold the incentive spirometer so that it is in an upright position. Before you use the spirometer, breathe out normally. Place the mouthpiece in your mouth. Make sure your lips are closed tightly around it. Breathe in slowly and as deeply as you can through your mouth, causing the piston or the ball to rise toward the top of the chamber. Hold your breath for 3-5 seconds, or for as long as possible. If the spirometer includes a coach indicator, use this to guide you in breathing. Slow down your breathing if the indicator goes above the marked areas. Remove the mouthpiece from your mouth and breathe out normally. The piston or ball will return to the bottom of the chamber. Rest for a few seconds, then repeat the steps 10 or more times. Take  your time and take a few normal breaths between deep breaths so that you do not get dizzy or light-headed. Do this every 1-2 hours when you are awake. If the spirometer includes a goal marker to show the highest number you have reached (best effort), use this as a goal to work toward during each repetition. After each set of 10 deep breaths, cough a few times. This will help to make sure that your lungs are clear. If you have an incision on your chest or abdomen from surgery, place a pillow or a rolled-up towel firmly against the incision when you cough. This can help to reduce pain while taking deep breaths and coughing. General tips When you are able to get out of bed: Walk around often. Continue to take deep breaths and cough in order to clear your lungs. Keep using the incentive spirometer until your health care provider says it is okay to stop using it. If you have been in the hospital, you  may be told to keep using the spirometer at home. Contact a health care provider if: You are having difficulty using the spirometer. You have trouble using the spirometer as often as instructed. Your pain medicine is not giving enough relief for you to use the spirometer as told. You have a fever. Get help right away if: You develop shortness of breath. You develop a cough with bloody mucus from the lungs. You have fluid or blood coming from an incision site after you cough. Summary An incentive spirometer is a tool that can help you learn to take long, deep breaths to keep your lungs clear and active. You may be asked to use a spirometer after a surgery, if you have a lung problem or a history of smoking, or if you have been inactive for a long period of time. Use your incentive spirometer as instructed every 1-2 hours while you are awake. If you have an incision on your chest or abdomen, place a pillow or a rolled-up towel firmly against your incision when you cough. This will help to reduce pain. Get help right away if you have shortness of breath, you cough up bloody mucus, or blood comes from your incision when you cough. This information is not intended to replace advice given to you by your health care provider. Make sure you discuss any questions you have with your health care provider. Document Revised: 06/01/2019 Document Reviewed: 06/01/2019 Elsevier Patient Education  2023 [employer].

## 2022-07-31 DIAGNOSIS — M1611 Unilateral primary osteoarthritis, right hip: Secondary | ICD-10-CM | POA: Diagnosis not present

## 2022-08-05 MED ORDER — CHLORHEXIDINE GLUCONATE 4 % EX SOLN
60.0000 mL | Freq: Once | CUTANEOUS | Status: AC
  Administered 2022-08-06: 4 via TOPICAL

## 2022-08-05 MED ORDER — GABAPENTIN 300 MG PO CAPS
300.0000 mg | ORAL_CAPSULE | Freq: Once | ORAL | Status: AC
  Administered 2022-08-06: 300 mg via ORAL

## 2022-08-05 MED ORDER — CHLORHEXIDINE GLUCONATE 0.12 % MT SOLN
15.0000 mL | Freq: Once | OROMUCOSAL | Status: AC
  Administered 2022-08-06: 15 mL via OROMUCOSAL

## 2022-08-05 MED ORDER — TRANEXAMIC ACID-NACL 1000-0.7 MG/100ML-% IV SOLN
1000.0000 mg | INTRAVENOUS | Status: AC
  Administered 2022-08-06: 1000 mg via INTRAVENOUS

## 2022-08-05 MED ORDER — CEFAZOLIN SODIUM-DEXTROSE 2-4 GM/100ML-% IV SOLN
2.0000 g | INTRAVENOUS | Status: AC
  Administered 2022-08-06: 2 g via INTRAVENOUS

## 2022-08-05 MED ORDER — ORAL CARE MOUTH RINSE: 15.0000 mL | Freq: Once | OROMUCOSAL | Status: AC

## 2022-08-05 MED ORDER — LACTATED RINGERS IV SOLN: INTRAVENOUS | Status: DC

## 2022-08-05 MED ORDER — CELECOXIB 200 MG PO CAPS
400.0000 mg | ORAL_CAPSULE | Freq: Once | ORAL | Status: AC
  Administered 2022-08-06: 400 mg via ORAL

## 2022-08-05 MED ORDER — DEXAMETHASONE SODIUM PHOSPHATE 10 MG/ML IJ SOLN
8.0000 mg | Freq: Once | INTRAMUSCULAR | Status: AC
  Administered 2022-08-06: 8 mg via INTRAVENOUS

## 2022-08-05 NOTE — H&P (Signed)
ORTHOPAEDIC HISTORY & PHYSICAL Kimberly Leon, [place] - 07/31/2022 1:15 PM EDT Formatting of this note is different from the original. NAME: Kimberly Leon H&P Date: 07/31/2022 Procedure Date: 08/06/2022  Chief Complaint: right hip pain  HPI Kimberly Leon is a 77 y.o. female who has severe hip pain and has failed conservative treatment including NSAID's and activity modification. She has requested operative intervention for relief of her DJD symptoms of her right hip. Patient states that she has a lot of questions regarding the upcoming surgery and what to expect before, during and after the surgery. Patient has no known cardiac history, does not see a cardiologist. Patient does not have a history of diabetes and does not take any diabetic medications.  Medications & Allergies Allergies: No Known Allergies  Home Medicines: Current Outpatient Medications on File Prior to Visit Medication Sig Dispense Refill benzonatate (TESSALON) 100 MG capsule Take 100 mg by mouth 3 (three) times daily as needed cetirizine (ZYRTEC) 10 MG tablet Take 1 tablet by mouth once daily cholecalciferol (VITAMIN D3) 2,000 unit tablet Take 2,000 Units by mouth once daily. citalopram (CELEXA) 40 MG tablet TAKE ONE TABLET BY MOUTH EVERY DAY 90 tablet 1 CLIMARA PRO 0.045-0.015 mg/24 hr patch APPLY 1 PATCH ONTO THE SKIN ONCE WEEKLY 4 patch 11 clindamycin (CLEOCIN T) 1 % lotion Apply 1 Application topically as directed cyanocobalamin (VITAMIN B12) 1000 MCG tablet Take 1 tablet by mouth once daily ECHINACEA ORAL Take 1 capsule by mouth once daily fluorometholone (FML) 0.1 % ophthalmic suspension Place 1 drop into both eyes as directed meloxicam (MOBIC) 15 MG tablet TAKE 1 TABLET BY MOUTH ONCE DAILY. 30 tablet 5 neomycin-polymyxin-dexAMETHasone (MAXITROL) ophthalmic suspension neomycin-polymyxin-dexameth 3.5 mg/mL-10,000 unit/mL-0.1% eye drops omeprazole (PRILOSEC) 20 MG DR capsule Take 1 capsule (20 mg total) by  mouth once daily 90 capsule 1 traZODone (DESYREL) 150 MG tablet TAKE ONE TABLET AT BEDTIME 90 tablet 1 predniSONE (DELTASONE) 10 mg tablet pack Take by mouth (Patient not taking: Reported on 07/31/2022)  No current facility-administered medications on file prior to visit.  Medical / Surgical History  Past Medical History: Diagnosis Date Allergic rhinitis Depression GERD (gastroesophageal reflux disease) Menopausal state Osteoarthritis   Past Surgical History: Procedure Laterality Date KNEE ARTHROSCOPY Left 10/22/2006 Menisectomy - Medial, femur chondroplasty ARTHROSCOPY KNEE Right Bilateral foot surgery for hallux rigidus COLONOSCOPY 01/20/2003, 02/17/2013 EGD 01/20/2003, 02/17/2013 Right rotator cuff tear   Physical Exam  Ht:157.5 cm (5\' 2" ) Wt:66.8 kg (147 lb 3.2 oz) BMI: Body mass index is 26.92 kg/m.  General/Constitutional: No apparent distress: well-nourished and well developed. Eyes: Pupils equal, round with synchronous movement. Lymphatic: No palpable adenopathy. Respiratory: Patient does not have any labored breathing. Has appropriate chest expansion with inspiration. Auscultation of the lung fields reveals no wheezes, rales, or rhonchi. Cardiovascular: Patient is noted to have a regularly regular rhythm, without any murmurs, heaves, rubs or gallops appreciated on auscultation. No edema, swelling or tenderness, except as noted in detailed exam. Integumentary: No impressive skin lesions present, except as noted in detailed exam. Neuro/Psych: Normal mood and affect, oriented to person, place and time. Musculoskeletal:  Right hip exam Upon inspection of the patient's right hip there does not appear to be any gross deformities or changes to the skin. There is no noticeable difference between musculature or alignment in the patient's left and right hip, although she states one feels longer than the other. With palpation patient states that she is having a little bit of  tenderness on the lateral  aspect as well as the anterior aspect of her hip. With range of motion testing patient has flexion capabilities to just past 100 degrees. Patient has difficulty with internal rotation and states that it is painful when trying to do so. External rotation does not elicit similar issues and has good rotation to about 35 degrees. Patient has 5 out of 5 strength with flexion at the hip, however is slightly weaker on the right side when compared with the left. Patient is neurovascularly intact down the right leg. States she has good sensation in all dermatomes noted in the lower extremity. Posterior tibial pulses appreciated (2+).  Imaging No imaging was ordered at this time. Her last set of x-rays was ordered on 06/26/2022. These x-rays showed notable right hip arthritis.  Data No results found for: "WBC", "HGB", "HCT", "PLT" Lab Results Component Value Date NA 132 (L) 10/03/2021 K 4.3 10/03/2021 CL 99 10/03/2021 CO2 26.7 10/03/2021 BUN 19 10/03/2021 CREATININE 0.7 10/03/2021 GLUCOSE 100 10/03/2021  No results found for: "APTT", "INR" No results found for: "COLORU", "CLARITYU", "SPECGRAV", "PHUR", "PROTEINUR", "GLUCOSEU", "KETONESU", "BLOODU", "NITRITE", "LEUKOCYTESUR", "BILIRUBINUR", "UROBILINOGEN", "RBCUA", "WBCUA", "SQUAMEPI", "CASTUA", "BACTERIA", "UACOMMENT"  Assesment and Plan Knee DJD  I have recommended that Kimberly Leon undergo right total hip arthroplasty. Consents has been signed. The risks, benefits, prognosis and alternatives including but not limited to DVT, PE, infection, neurovascular injury, failure of the procedure and death were explained to the patient and she is willing to proceed with surgery as described to her by myself. Plan will be for post operative admission of at least 1 midnights for pain control and PT. She will be managed with DVT prophylaxis, antibiotics preoperatively and aggressive in patient rehab.  All aspects of the operation-pre-,  intra and postoperative interventions were discussed with the patient.  Medical reconciliation was performed, discussed discontinuing taking meloxicam tomorrow up until surgery. She will also stop any vitamin supplements she may be taking. Currently not taking any blood thinning medication, blood pressure or diabetes medications  X-rays are up-to-date  Denies having any issues with anesthesia in the past  All the patient's questions were answered and consented to proceed with right total hip arthroplasty.  Kimberly Rover, PA [facility] 07/31/2022 Electronically signed by Kimberly Rover, PA at 07/31/2022 6:53 PM EDT

## 2022-08-06 ENCOUNTER — Other Ambulatory Visit: Payer: Self-pay

## 2022-08-06 ENCOUNTER — Encounter: Payer: Self-pay | Admitting: Orthopedic Surgery

## 2022-08-06 ENCOUNTER — Ambulatory Visit: Payer: PPO | Admitting: Anesthesiology

## 2022-08-06 ENCOUNTER — Ambulatory Visit: Payer: PPO | Admitting: Urgent Care

## 2022-08-06 ENCOUNTER — Encounter
Admission: RE | Disposition: A | Discharge status: Home / Self Care | Payer: Self-pay | Source: Ambulatory Visit | Attending: Orthopedic Surgery

## 2022-08-06 ENCOUNTER — Observation Stay
Admission: RE | Admit: 2022-08-06 | Discharge: 2022-08-07 | Disposition: A | Discharge status: Home / Self Care | Payer: PPO | Source: Ambulatory Visit | Attending: Orthopedic Surgery | Admitting: Orthopedic Surgery

## 2022-08-06 ENCOUNTER — Observation Stay: Payer: PPO

## 2022-08-06 DIAGNOSIS — Z96641 Presence of right artificial hip joint: Secondary | ICD-10-CM

## 2022-08-06 DIAGNOSIS — Z471 Aftercare following joint replacement surgery: Secondary | ICD-10-CM | POA: Diagnosis not present

## 2022-08-06 DIAGNOSIS — M1611 Unilateral primary osteoarthritis, right hip: Secondary | ICD-10-CM | POA: Diagnosis not present

## 2022-08-06 HISTORY — PX: TOTAL HIP ARTHROPLASTY: SHX124

## 2022-08-06 LAB — ABO/RH: ABO/RH(D): O POS

## 2022-08-06 SURGERY — ARTHROPLASTY, HIP, TOTAL,POSTERIOR APPROACH
Anesthesia: Spinal | Site: Hip | Laterality: Right

## 2022-08-06 MED ORDER — CELECOXIB 200 MG PO CAPS
ORAL_CAPSULE | ORAL | Status: AC
  Filled 2022-08-06: qty 1

## 2022-08-06 MED ORDER — DEXAMETHASONE SODIUM PHOSPHATE 10 MG/ML IJ SOLN
INTRAMUSCULAR | Status: AC
  Filled 2022-08-06: qty 1

## 2022-08-06 MED ORDER — CELECOXIB 200 MG PO CAPS
ORAL_CAPSULE | ORAL | Status: AC
  Filled 2022-08-06: qty 2

## 2022-08-06 MED ORDER — PROPOFOL 10 MG/ML IV BOLUS
INTRAVENOUS | Status: AC
  Filled 2022-08-06: qty 20

## 2022-08-06 MED ORDER — MIDAZOLAM HCL 5 MG/5ML IJ SOLN
INTRAMUSCULAR | Status: DC | PRN
  Administered 2022-08-06 (×2): .5 mg via INTRAVENOUS

## 2022-08-06 MED ORDER — LORATADINE 10 MG PO TABS
ORAL_TABLET | ORAL | Status: AC
  Filled 2022-08-06: qty 1

## 2022-08-06 MED ORDER — TRAMADOL HCL 50 MG PO TABS
50.0000 mg | ORAL_TABLET | ORAL | Status: DC | PRN
  Administered 2022-08-06 – 2022-08-07 (×2): 50 mg via ORAL

## 2022-08-06 MED ORDER — MIDAZOLAM HCL 2 MG/2ML IJ SOLN
INTRAMUSCULAR | Status: AC
  Filled 2022-08-06: qty 2

## 2022-08-06 MED ORDER — ACETAMINOPHEN 10 MG/ML IV SOLN
INTRAVENOUS | Status: AC
  Filled 2022-08-06: qty 100

## 2022-08-06 MED ORDER — ACETAMINOPHEN 325 MG PO TABS: 325.0000 mg | ORAL_TABLET | Freq: Four times a day (QID) | ORAL | Status: DC | PRN

## 2022-08-06 MED ORDER — ENOXAPARIN SODIUM 40 MG/0.4ML IJ SOSY
40.0000 mg | PREFILLED_SYRINGE | INTRAMUSCULAR | Status: DC
  Administered 2022-08-07: 40 mg via SUBCUTANEOUS

## 2022-08-06 MED ORDER — FENTANYL CITRATE (PF) 100 MCG/2ML IJ SOLN
INTRAMUSCULAR | Status: AC
  Filled 2022-08-06: qty 2

## 2022-08-06 MED ORDER — CELECOXIB 200 MG PO CAPS
200.0000 mg | ORAL_CAPSULE | Freq: Two times a day (BID) | ORAL | Status: DC
  Administered 2022-08-06 – 2022-08-07 (×3): 200 mg via ORAL

## 2022-08-06 MED ORDER — METOCLOPRAMIDE HCL 10 MG PO TABS
ORAL_TABLET | ORAL | Status: AC
  Filled 2022-08-06: qty 1

## 2022-08-06 MED ORDER — ACETAMINOPHEN 10 MG/ML IV SOLN
INTRAVENOUS | Status: DC | PRN
  Administered 2022-08-06: 1000 mg via INTRAVENOUS

## 2022-08-06 MED ORDER — DIPHENHYDRAMINE HCL 12.5 MG/5ML PO ELIX: 12.5000 mg | ORAL_SOLUTION | ORAL | Status: DC | PRN

## 2022-08-06 MED ORDER — MENTHOL 3 MG MT LOZG: 1.0000 | LOZENGE | OROMUCOSAL | Status: DC | PRN

## 2022-08-06 MED ORDER — CEFAZOLIN SODIUM-DEXTROSE 2-4 GM/100ML-% IV SOLN
INTRAVENOUS | Status: AC
  Filled 2022-08-06: qty 100

## 2022-08-06 MED ORDER — PHENYLEPHRINE 80 MCG/ML (10ML) SYRINGE FOR IV PUSH (FOR BLOOD PRESSURE SUPPORT)
PREFILLED_SYRINGE | INTRAVENOUS | Status: AC
  Filled 2022-08-06: qty 10

## 2022-08-06 MED ORDER — HYDROMORPHONE HCL 1 MG/ML IJ SOLN: 0.5000 mg | INTRAMUSCULAR | Status: DC | PRN

## 2022-08-06 MED ORDER — GLYCOPYRROLATE 0.2 MG/ML IJ SOLN
INTRAMUSCULAR | Status: AC
  Filled 2022-08-06: qty 1

## 2022-08-06 MED ORDER — PHENYLEPHRINE HCL-NACL 20-0.9 MG/250ML-% IV SOLN
INTRAVENOUS | Status: DC | PRN
  Administered 2022-08-06: 15 ug/min via INTRAVENOUS

## 2022-08-06 MED ORDER — TRANEXAMIC ACID-NACL 1000-0.7 MG/100ML-% IV SOLN
INTRAVENOUS | Status: AC
  Filled 2022-08-06: qty 100

## 2022-08-06 MED ORDER — SENNOSIDES-DOCUSATE SODIUM 8.6-50 MG PO TABS
1.0000 | ORAL_TABLET | Freq: Two times a day (BID) | ORAL | Status: DC
  Administered 2022-08-06 – 2022-08-07 (×3): 1 via ORAL

## 2022-08-06 MED ORDER — PROPOFOL 10 MG/ML IV BOLUS
INTRAVENOUS | Status: DC | PRN
  Administered 2022-08-06: 15 mg via INTRAVENOUS
  Administered 2022-08-06: 20 mg via INTRAVENOUS

## 2022-08-06 MED ORDER — CHLORHEXIDINE GLUCONATE 0.12 % MT SOLN
OROMUCOSAL | Status: AC
  Filled 2022-08-06: qty 15

## 2022-08-06 MED ORDER — BISACODYL 10 MG RE SUPP: 10.0000 mg | Freq: Every day | RECTAL | Status: DC | PRN

## 2022-08-06 MED ORDER — ONDANSETRON HCL 4 MG PO TABS: 4.0000 mg | ORAL_TABLET | Freq: Four times a day (QID) | ORAL | Status: DC | PRN

## 2022-08-06 MED ORDER — FENTANYL CITRATE (PF) 100 MCG/2ML IJ SOLN
25.0000 ug | INTRAMUSCULAR | Status: DC | PRN
  Administered 2022-08-06 (×4): 25 ug via INTRAVENOUS

## 2022-08-06 MED ORDER — SURGIRINSE WOUND IRRIGATION SYSTEM - OPTIME
TOPICAL | Status: DC | PRN
  Administered 2022-08-06: 450 mL

## 2022-08-06 MED ORDER — FLUOROMETHOLONE 0.1 % OP SUSP
1.0000 [drp] | OPHTHALMIC | Status: DC
  Administered 2022-08-06: 1 [drp] via OPHTHALMIC
  Filled 2022-08-06: qty 5

## 2022-08-06 MED ORDER — PROPOFOL 500 MG/50ML IV EMUL
INTRAVENOUS | Status: DC | PRN
  Administered 2022-08-06: 70 ug/kg/min via INTRAVENOUS

## 2022-08-06 MED ORDER — PANTOPRAZOLE SODIUM 40 MG PO TBEC
DELAYED_RELEASE_TABLET | ORAL | Status: AC
  Filled 2022-08-06: qty 1

## 2022-08-06 MED ORDER — SODIUM CHLORIDE 0.9 % IV SOLN: INTRAVENOUS | Status: DC

## 2022-08-06 MED ORDER — ALUM & MAG HYDROXIDE-SIMETH 200-200-20 MG/5ML PO SUSP: 30.0000 mL | ORAL | Status: DC | PRN

## 2022-08-06 MED ORDER — PROPOFOL 1000 MG/100ML IV EMUL
INTRAVENOUS | Status: AC
  Filled 2022-08-06: qty 100

## 2022-08-06 MED ORDER — ESTRADIOL-LEVONORGESTREL 0.045-0.015 MG/DAY TD PTWK: 1.0000 | MEDICATED_PATCH | TRANSDERMAL | Status: DC

## 2022-08-06 MED ORDER — CEFAZOLIN SODIUM-DEXTROSE 2-4 GM/100ML-% IV SOLN
2.0000 g | Freq: Four times a day (QID) | INTRAVENOUS | Status: AC
  Administered 2022-08-06 (×2): 2 g via INTRAVENOUS

## 2022-08-06 MED ORDER — ONDANSETRON HCL 4 MG/2ML IJ SOLN
INTRAMUSCULAR | Status: AC
  Filled 2022-08-06: qty 2

## 2022-08-06 MED ORDER — ACETAMINOPHEN 10 MG/ML IV SOLN
1000.0000 mg | Freq: Four times a day (QID) | INTRAVENOUS | Status: AC
  Administered 2022-08-06 – 2022-08-07 (×4): 1000 mg via INTRAVENOUS
  Filled 2022-08-06: qty 100

## 2022-08-06 MED ORDER — GABAPENTIN 300 MG PO CAPS
ORAL_CAPSULE | ORAL | Status: AC
  Filled 2022-08-06: qty 1

## 2022-08-06 MED ORDER — OXYCODONE HCL 5 MG PO TABS
ORAL_TABLET | ORAL | Status: AC
  Filled 2022-08-06: qty 1

## 2022-08-06 MED ORDER — MAGNESIUM HYDROXIDE 400 MG/5ML PO SUSP
30.0000 mL | Freq: Every day | ORAL | Status: DC
  Administered 2022-08-06 – 2022-08-07 (×2): 30 mL via ORAL

## 2022-08-06 MED ORDER — FENTANYL CITRATE (PF) 100 MCG/2ML IJ SOLN
INTRAMUSCULAR | Status: DC | PRN
  Administered 2022-08-06 (×5): 25 ug via INTRAVENOUS

## 2022-08-06 MED ORDER — NEOMYCIN-POLYMYXIN-DEXAMETH 3.5-10000-0.1 OP SUSP: 1.0000 [drp] | OPHTHALMIC | Status: DC | PRN

## 2022-08-06 MED ORDER — SENNOSIDES-DOCUSATE SODIUM 8.6-50 MG PO TABS
ORAL_TABLET | ORAL | Status: AC
  Filled 2022-08-06: qty 1

## 2022-08-06 MED ORDER — LORATADINE 10 MG PO TABS
10.0000 mg | ORAL_TABLET | Freq: Every day | ORAL | Status: DC
  Administered 2022-08-06 – 2022-08-07 (×2): 10 mg via ORAL

## 2022-08-06 MED ORDER — PANTOPRAZOLE SODIUM 40 MG PO TBEC
40.0000 mg | DELAYED_RELEASE_TABLET | Freq: Two times a day (BID) | ORAL | Status: DC
  Administered 2022-08-06 – 2022-08-07 (×3): 40 mg via ORAL

## 2022-08-06 MED ORDER — PHENYLEPHRINE HCL (PRESSORS) 10 MG/ML IV SOLN
INTRAVENOUS | Status: DC | PRN
  Administered 2022-08-06: 160 ug via INTRAVENOUS
  Administered 2022-08-06: 80 ug via INTRAVENOUS
  Administered 2022-08-06: 160 ug via INTRAVENOUS

## 2022-08-06 MED ORDER — OXYCODONE HCL 5 MG PO TABS
5.0000 mg | ORAL_TABLET | ORAL | Status: DC | PRN
  Administered 2022-08-06 (×2): 5 mg via ORAL

## 2022-08-06 MED ORDER — OXYCODONE HCL 5 MG PO TABS
ORAL_TABLET | ORAL | Status: AC
  Filled 2022-08-06: qty 2

## 2022-08-06 MED ORDER — SEVOFLURANE IN SOLN
RESPIRATORY_TRACT | Status: AC
  Filled 2022-08-06: qty 250

## 2022-08-06 MED ORDER — OXYCODONE HCL 5 MG PO TABS: 5.0000 mg | ORAL_TABLET | Freq: Once | ORAL | Status: DC | PRN

## 2022-08-06 MED ORDER — ONDANSETRON HCL 4 MG/2ML IJ SOLN
INTRAMUSCULAR | Status: DC | PRN
  Administered 2022-08-06: 4 mg via INTRAVENOUS

## 2022-08-06 MED ORDER — OXYCODONE HCL 5 MG/5ML PO SOLN: 5.0000 mg | Freq: Once | ORAL | Status: DC | PRN

## 2022-08-06 MED ORDER — CITALOPRAM HYDROBROMIDE 20 MG PO TABS
40.0000 mg | ORAL_TABLET | Freq: Every day | ORAL | Status: DC
  Administered 2022-08-06 – 2022-08-07 (×2): 40 mg via ORAL
  Filled 2022-08-06 (×2): qty 2

## 2022-08-06 MED ORDER — FLEET ENEMA 7-19 GM/118ML RE ENEM: 1.0000 | ENEMA | Freq: Once | RECTAL | Status: DC | PRN

## 2022-08-06 MED ORDER — FLUOROMETHOLONE 0.1 % OP SUSP
1.0000 [drp] | OPHTHALMIC | Status: DC
  Administered 2022-08-06 (×2): 1 [drp] via OPHTHALMIC

## 2022-08-06 MED ORDER — EPHEDRINE SULFATE (PRESSORS) 50 MG/ML IJ SOLN
INTRAMUSCULAR | Status: DC | PRN
  Administered 2022-08-06 (×4): 5 mg via INTRAVENOUS

## 2022-08-06 MED ORDER — METOCLOPRAMIDE HCL 10 MG PO TABS
10.0000 mg | ORAL_TABLET | Freq: Three times a day (TID) | ORAL | Status: DC
  Administered 2022-08-06 – 2022-08-07 (×3): 10 mg via ORAL

## 2022-08-06 MED ORDER — ONDANSETRON HCL 4 MG/2ML IJ SOLN: 4.0000 mg | Freq: Four times a day (QID) | INTRAMUSCULAR | Status: DC | PRN

## 2022-08-06 MED ORDER — SODIUM CHLORIDE 0.9 % IR SOLN
Status: DC | PRN
  Administered 2022-08-06: 3000 mL

## 2022-08-06 MED ORDER — TRAMADOL HCL 50 MG PO TABS
ORAL_TABLET | ORAL | Status: AC
  Filled 2022-08-06: qty 1

## 2022-08-06 MED ORDER — BUPIVACAINE IN DEXTROSE 0.75-8.25 % IT SOLN
INTRATHECAL | Status: DC | PRN
  Administered 2022-08-06: 1.5 mL via INTRATHECAL

## 2022-08-06 MED ORDER — 0.9 % SODIUM CHLORIDE (POUR BTL) OPTIME
TOPICAL | Status: DC | PRN
  Administered 2022-08-06: 500 mL

## 2022-08-06 MED ORDER — MAGNESIUM HYDROXIDE 400 MG/5ML PO SUSP
ORAL | Status: AC
  Filled 2022-08-06: qty 30

## 2022-08-06 MED ORDER — EPHEDRINE 5 MG/ML INJ
INTRAVENOUS | Status: AC
  Filled 2022-08-06: qty 5

## 2022-08-06 MED ORDER — OXYCODONE HCL 5 MG PO TABS
10.0000 mg | ORAL_TABLET | ORAL | Status: DC | PRN
  Administered 2022-08-06 – 2022-08-07 (×3): 10 mg via ORAL

## 2022-08-06 MED ORDER — TRANEXAMIC ACID-NACL 1000-0.7 MG/100ML-% IV SOLN
1000.0000 mg | Freq: Once | INTRAVENOUS | Status: AC
  Administered 2022-08-06: 1000 mg via INTRAVENOUS

## 2022-08-06 MED ORDER — PHENOL 1.4 % MT LIQD: 1.0000 | OROMUCOSAL | Status: DC | PRN

## 2022-08-06 MED ORDER — ENSURE PRE-SURGERY PO LIQD
296.0000 mL | Freq: Once | ORAL | Status: AC
  Administered 2022-08-06: 296 mL via ORAL
  Filled 2022-08-06: qty 296

## 2022-08-06 MED ORDER — TRAZODONE HCL 50 MG PO TABS
150.0000 mg | ORAL_TABLET | Freq: Every day | ORAL | Status: DC
  Administered 2022-08-06: 150 mg via ORAL
  Filled 2022-08-06: qty 1

## 2022-08-06 SURGICAL SUPPLY — 54 items
BLADE SAW 90X25X1.19 OSCILLAT (BLADE) ×1 IMPLANT
CUP ACETBLR 48 OD 100 SERIES (Hips) IMPLANT
DRAPE 3/4 80X56 (DRAPES) ×1 IMPLANT
DRAPE INCISE IOBAN 66X60 STRL (DRAPES) ×1 IMPLANT
DRSG AQUACEL AG ADV 3.5X14 (GAUZE/BANDAGES/DRESSINGS) ×1 IMPLANT
DRSG DERMACEA 8X12 NADH (GAUZE/BANDAGES/DRESSINGS) IMPLANT
DRSG MEPILEX SACRM 8.7X9.8 (GAUZE/BANDAGES/DRESSINGS) ×1 IMPLANT
DRSG TEGADERM 4X4.75 (GAUZE/BANDAGES/DRESSINGS) ×1 IMPLANT
DRSG TELFA 3X4 N-ADH STERILE (GAUZE/BANDAGES/DRESSINGS) ×1 IMPLANT
DURAPREP 26ML APPLICATOR (WOUND CARE) ×2 IMPLANT
ELECT CAUTERY BLADE 6.4 (BLADE) ×1 IMPLANT
ELECT REM PT RETURN 9FT ADLT (ELECTROSURGICAL) ×1
ELECTRODE REM PT RTRN 9FT ADLT (ELECTROSURGICAL) ×1 IMPLANT
GLOVE BIOGEL M STRL SZ7.5 (GLOVE) ×2 IMPLANT
GLOVE SRG 8 PF TXTR STRL LF DI (GLOVE) ×2 IMPLANT
GLOVE SURG SYN 8.0 (GLOVE) ×2 IMPLANT
GLOVE SURG SYN 8.0 PF PI (GLOVE) ×2 IMPLANT
GLOVE SURG UNDER POLY LF SZ8 (GLOVE) ×2
GOWN STRL REUS W/ TWL LRG LVL3 (GOWN DISPOSABLE) ×2 IMPLANT
GOWN STRL REUS W/ TWL XL LVL3 (GOWN DISPOSABLE) ×1 IMPLANT
GOWN STRL REUS W/TWL LRG LVL3 (GOWN DISPOSABLE) ×2
GOWN STRL REUS W/TWL XL LVL3 (GOWN DISPOSABLE) ×1
GOWN TOGA ZIPPER T7+ PEEL AWAY (MISCELLANEOUS) ×1 IMPLANT
HANDLE YANKAUER SUCT OPEN TIP (MISCELLANEOUS) ×1 IMPLANT
HEAD FEM STD 32X+1 STRL (Hips) IMPLANT
HEMOVAC 400CC 10FR (MISCELLANEOUS) ×1 IMPLANT
HOLDER FOLEY CATH W/STRAP (MISCELLANEOUS) ×1 IMPLANT
HOOD PEEL AWAY T7 (MISCELLANEOUS) ×1 IMPLANT
IV NS IRRIG 3000ML ARTHROMATIC (IV SOLUTION) ×1 IMPLANT
KIT PEG BOARD PINK (KITS) ×1 IMPLANT
KIT TURNOVER KIT A (KITS) ×1 IMPLANT
LINER MAR 4 10 32X48 (Hips) ×1 IMPLANT
LINER MARATHON 4 10 32X48 (Hips) IMPLANT
MANIFOLD NEPTUNE II (INSTRUMENTS) ×2 IMPLANT
NS IRRIG 500ML POUR BTL (IV SOLUTION) ×1 IMPLANT
PACK HIP PROSTHESIS (MISCELLANEOUS) ×1 IMPLANT
PULSAVAC PLUS IRRIG FAN TIP (DISPOSABLE) ×1
SOL PREP PVP 2OZ (MISCELLANEOUS) ×1
SOLUTION IRRIG SURGIPHOR (IV SOLUTION) ×1 IMPLANT
SOLUTION PREP PVP 2OZ (MISCELLANEOUS) ×1 IMPLANT
SPONGE DRAIN TRACH 4X4 STRL 2S (GAUZE/BANDAGES/DRESSINGS) ×1 IMPLANT
STAPLER SKIN PROX 35W (STAPLE) ×1 IMPLANT
STEM FEM ACTIS HIGH SZ2 (Stem) IMPLANT
SUT ETHIBOND #5 BRAIDED 30INL (SUTURE) ×1 IMPLANT
SUT VIC AB 0 CT1 36 (SUTURE) ×1 IMPLANT
SUT VIC AB 1 CT1 36 (SUTURE) ×2 IMPLANT
SUT VIC AB 2-0 CT1 27 (SUTURE) ×1
SUT VIC AB 2-0 CT1 TAPERPNT 27 (SUTURE) ×1 IMPLANT
TAPE CLOTH 3X10 WHT NS LF (GAUZE/BANDAGES/DRESSINGS) ×1 IMPLANT
TIP FAN IRRIG PULSAVAC PLUS (DISPOSABLE) ×1 IMPLANT
TOWEL OR 17X26 4PK STRL BLUE (TOWEL DISPOSABLE) IMPLANT
TRAP FLUID SMOKE EVACUATOR (MISCELLANEOUS) ×1 IMPLANT
TRAY FOLEY MTR SLVR 16FR STAT (SET/KITS/TRAYS/PACK) ×1 IMPLANT
WATER STERILE IRR 1000ML POUR (IV SOLUTION) ×1 IMPLANT

## 2022-08-06 NOTE — Transfer of Care (Signed)
Immediate Anesthesia Transfer of Care Note  Patient: Kimberly Leon  Procedure(s) Performed: TOTAL HIP ARTHROPLASTY (Right: Hip)  Patient Location: PACU  Anesthesia Type:Spinal  Level of Consciousness: drowsy  Airway & Oxygen Therapy: Patient Spontanous Breathing and Patient connected to face mask oxygen  Post-op Assessment: Report given to RN and Post -op Vital signs reviewed and stable  Post vital signs: Reviewed and stable  Last Vitals:  Vitals Value Taken Time  BP 124/48 08/06/22 1030  Temp    Pulse 71 08/06/22 1035  Resp 15 08/06/22 1035  SpO2 96 % 08/06/22 1035  Vitals shown include unvalidated device data.  Last Pain:  Vitals:   08/06/22 0622  TempSrc: Temporal  PainSc: 0-No pain         Complications: No notable events documented.

## 2022-08-06 NOTE — Evaluation (Signed)
Physical Therapy Evaluation Patient Details Name: Kimberly Leon MRN: [id number] DOB: June 22, 1945 Today's Date: 08/06/2022  History of Present Illness  Pt is a 77 yo female s/p R THA. PMH of skin cancer, GERD, foot surgery, shoulder surgery.  Clinical Impression  Pt A&Ox4, reported 6/10 R hip pain as well as R eye pain (baseline issue). She stated she lives with her husband in a one story home, previously independent.   She was able to perform a few exercises throughout session, with verbal/tactile cues. Supine to sit with CGA and extended time, cued to attend to posterior hip precautions. Pt complained of dizziness throughout mobility but BP WFLs. Sit <> Stand with RW and CGA, and step pivot to recliner. Pt up in chair with needs in reach.  Overall the patient demonstrated deficits (see "PT Problem List") that impede the patient's functional abilities, safety, and mobility and would benefit from skilled PT intervention. Recommendation is to continue skilled PT services to return to PLOF.        Recommendations for follow up therapy are one component of a multi-disciplinary discharge planning process, led by the attending physician.  Recommendations may be updated based on patient status, additional functional criteria and insurance authorization.  Follow Up Recommendations       Assistance Recommended at Discharge Frequent or constant Supervision/Assistance  Patient can return home with the following  A little help with walking and/or transfers;Assistance with cooking/housework;Assist for transportation;Help with stairs or ramp for entrance;A little help with bathing/dressing/bathroom    Equipment Recommendations None recommended by PT (pt has RW and BSC at home)  Recommendations for Other Services       Functional Status Assessment Patient has had a recent decline in their functional status and demonstrates the ability to make significant improvements in function in a reasonable and  predictable amount of time.     Precautions / Restrictions Precautions Precautions: Fall;Posterior Hip Precaution Booklet Issued: Yes (comment) Restrictions Weight Bearing Restrictions: Yes RLE Weight Bearing: Weight bearing as tolerated      Mobility  Bed Mobility Overal bed mobility: Needs Assistance Bed Mobility: Supine to Sit     Supine to sit: Min guard          Transfers Overall transfer level: Needs assistance Equipment used: Rolling walker (2 wheels) Transfers: Sit to/from Stand, Bed to chair/wheelchair/BSC Sit to Stand: Min guard   Step pivot transfers: Min guard            Ambulation/Gait               General Gait Details: deferred due to pt dizziness  Stairs            Wheelchair Mobility    Modified Rankin (Stroke Patients Only)       Balance Overall balance assessment: Needs assistance Sitting-balance support: Feet supported Sitting balance-Leahy Scale: Good     Standing balance support: Reliant on assistive device for balance Standing balance-Leahy Scale: Fair                               Pertinent Vitals/Pain Pain Assessment Pain Assessment: 0-10 Pain Score: 6  Pain Location: lateral, posterior hip Pain Descriptors / Indicators: Aching, Sharp Pain Intervention(s): Limited activity within patient's tolerance, Ice applied, Monitored during session, Premedicated before session, Patient requesting pain meds-RN notified, Repositioned    Home Living Family/patient expects to be discharged to:: Private residence Living Arrangements: Spouse/significant other Available Help at  Discharge: Family Type of Home: House Home Access: Stairs to enter Entrance Stairs-Rails: Lawyer of Steps: 6   Home Layout: One level Home Equipment: Teacher, English as a foreign language (2 wheels)      Prior Function Prior Level of Function : Independent/Modified Independent                     Hand  Dominance        Extremity/Trunk Assessment   Upper Extremity Assessment Upper Extremity Assessment: Overall WFL for tasks assessed    Lower Extremity Assessment Lower Extremity Assessment:  (s/p R THA, LLE WFLs)    Cervical / Trunk Assessment Cervical / Trunk Assessment: Normal  Communication   Communication: No difficulties  Cognition Arousal/Alertness: Awake/alert Behavior During Therapy: WFL for tasks assessed/performed Overall Cognitive Status: Within Functional Limits for tasks assessed                                          General Comments      Exercises Total Joint Exercises Ankle Circles/Pumps: AROM, Both, 10 reps Quad Sets: AROM, Strengthening, Both, 10 reps Long Arc Quad: AROM, Strengthening, Right, 10 reps   Assessment/Plan    PT Assessment Patient needs continued PT services  PT Problem List Decreased strength;Decreased mobility;Decreased range of motion;Decreased activity tolerance;Decreased balance;Pain;Decreased knowledge of use of DME;Decreased knowledge of precautions       PT Treatment Interventions DME instruction;Therapeutic activities;Gait training;Therapeutic exercise;Patient/family education;Balance training;Stair training;Functional mobility training;Neuromuscular re-education    PT Goals (Current goals can be found in the Care Plan section)  Acute Rehab PT Goals Patient Stated Goal: to go home PT Goal Formulation: With patient Time For Goal Achievement: 08/20/22 Potential to Achieve Goals: Good    Frequency BID     Co-evaluation               AM-PAC PT "6 Clicks" Mobility  Outcome Measure Help needed turning from your back to your side while in a flat bed without using bedrails?: None Help needed moving from lying on your back to sitting on the side of a flat bed without using bedrails?: None Help needed moving to and from a bed to a chair (including a wheelchair)?: None Help needed standing up from a chair  using your arms (e.g., wheelchair or bedside chair)?: None Help needed to walk in hospital room?: A Little Help needed climbing 3-5 steps with a railing? : A Little 6 Click Score: 22    End of Session Equipment Utilized During Treatment: Gait belt Activity Tolerance: Patient tolerated treatment well Patient left: in chair;with call bell/phone within reach;with SCD's reapplied Nurse Communication: Mobility status PT Visit Diagnosis: Other abnormalities of gait and mobility (R26.89);Muscle weakness (generalized) (M62.81);Pain Pain - Right/Left: Right Pain - part of body: Hip    Time: 1610-9604 PT Time Calculation (min) (ACUTE ONLY): 23 min   Charges:   PT Evaluation $PT Eval Low Complexity: 1 Low PT Treatments $Therapeutic Activity: 8-22 mins        Olga Coaster PT, DPT 3:42 PM,08/06/22

## 2022-08-06 NOTE — Op Note (Signed)
OPERATIVE NOTE  DATE OF SURGERY:  08/06/2022  PATIENT NAME:  MELAYSIA AGOGLIA   DOB: September 26, 1945  MRN: [id number]  PRE-OPERATIVE DIAGNOSIS: Degenerative arthrosis of the right hip, primary  POST-OPERATIVE DIAGNOSIS:  Same  PROCEDURE:  Right total hip arthroplasty  SURGEON:  Jena Gauss. M.D.  ASSISTANT:  Gean Birchwood, PA-C (present and scrubbed throughout the case, critical for assistance with exposure, retraction, instrumentation, and closure)  ANESTHESIA: spinal  ESTIMATED BLOOD LOSS: 75 mL  FLUIDS REPLACED: 1250 mL of crystalloid  DRAINS: 2 medium Hemovac drains  IMPLANTS UTILIZED: DePuy size 2 high offset Actis femoral stem, 48 mm OD Pinnacle 100 acetabular component, +4 mm 10 degree Pinnacle Marathon polyethylene insert, and a 32 mm CoCr +1 mm hip ball  INDICATIONS FOR SURGERY: CADENCE LAMAY is a 77 y.o. year old female with a long history of progressive hip and groin  pain. X-rays demonstrated severe degenerative changes. The patient had not seen any significant improvement despite conservative nonsurgical intervention. After discussion of the risks and benefits of surgical intervention, the patient expressed understanding of the risks benefits and agree with plans for total hip arthroplasty.   The risks, benefits, and alternatives were discussed at length including but not limited to the risks of infection, bleeding, nerve injury, stiffness, blood clots, the need for revision surgery, limb length inequality, dislocation, cardiopulmonary complications, among others, and they were willing to proceed.  PROCEDURE IN DETAIL: The patient was brought into the operating room and, after adequate spinal anesthesia was achieved, the patient was placed in a left lateral decubitus position. Axillary roll was placed and all bony prominences were well-padded. The patient's right hip was cleaned and prepped with alcohol and DuraPrep and draped in the usual sterile fashion. A "timeout"  was performed as per usual protocol. A lateral curvilinear incision was made gently curving towards the posterior superior iliac spine. The IT band was incised in line with the skin incision and the fibers of the gluteus maximus were split in line. The piriformis tendon was identified, skeletonized, and incised at its insertion to the proximal femur and reflected posteriorly. A T type posterior capsulotomy was performed. Prior to dislocation of the femoral head, a threaded Steinmann pin was inserted through a separate stab incision into the pelvis superior to the acetabulum and bent in the form of a stylus so as to assess limb length and hip offset throughout the procedure. The femoral head was then dislocated posteriorly. Inspection of the femoral head demonstrated severe degenerative changes with full-thickness loss of articular cartilage. The femoral neck cut was performed using an oscillating saw. The anterior capsule was elevated off of the femoral neck using a periosteal elevator. Attention was then directed to the acetabulum. The remnant of the labrum was excised using electrocautery. Inspection of the acetabulum also demonstrated significant degenerative changes. The acetabulum was reamed in sequential fashion up to a 47 mm diameter. Good punctate bleeding bone was encountered. A 48 mm Pinnacle 100 acetabular component was positioned and impacted into place. Good scratch fit was appreciated. A 4 mm neutral polyethylene trial was inserted.  Attention was then directed to the proximal femur.  Femoral broaches were inserted in a sequential fashion up to a size 2 broach. Calcar region was planed and a trial reduction was performed using a standard offset neck and a 32 mm hip ball with a +1 mm neck length.  Fair stability was noted it was elected there was decrease in the hip offset and slight  lengthening.  The broach was countersunk several millimeters and the calcar region planed.  A +4 mm 10 degree  polyethylene trial was inserted with the high side at the 8 o'clock position.  A high offset neck segment was placed with the 32 mm hip ball with a +1 mm neck length.  Good equalization of limb lengths and hip offset was appreciated and excellent stability was noted both anteriorly and posteriorly. Trial components were removed. The acetabular shell was irrigated with copious amounts of normal saline with antibiotic solution and suctioned dry. A +4 mm 10 degree Pinnacle Marathon polyethylene insert was positioned with the high side at the 8 o'clock position and impacted into place. Next, a size 2 high offset Actis femoral stem was positioned and impacted into place. Excellent scratch fit was appreciated. A trial reduction was again performed with a 32 mm hip ball with a swollen mm neck length. Again, good equalization of limb lengths was appreciated and excellent stability appreciated both anteriorly and posteriorly. The hip was then dislocated and the trial hip ball was removed. The Morse taper was cleaned and dried. A 32 mm cobalt chrome hip ball with a +1 mm neck length was placed on the trunnion and impacted into place. The hip was then reduced and placed through range of motion. Excellent stability was appreciated both anteriorly and posteriorly.  The wound was irrigated with copious amounts of normal saline followed by 450 ml of Surgiphor and suctioned dry. Good hemostasis was appreciated. The posterior capsulotomy was repaired using #5 Ethibond. Piriformis tendon was reapproximated to the undersurface of the gluteus medius tendon using #5 Ethibond. The IT band was reapproximated using interrupted sutures of #1 Vicryl. Subcutaneous tissue was approximated using first #0 Vicryl followed by #2-0 Vicryl. The skin was closed with skin staples.  The patient tolerated the procedure well and was transported to the recovery room in stable condition.   Jena Gauss., M.D.

## 2022-08-06 NOTE — Anesthesia Procedure Notes (Signed)
Spinal  Patient location during procedure: OR Start time: 08/06/2022 7:15 AM End time: 08/06/2022 7:17 AM Reason for block: surgical anesthesia Staffing Performed: anesthesiologist  Anesthesiologist: Stephanie Coup, MD Performed by: Stephanie Coup, MD Authorized by: Stephanie Coup, MD   Preanesthetic Checklist Completed: patient identified, IV checked, site marked, risks and benefits discussed, surgical consent, monitors and equipment checked, pre-op evaluation and timeout performed Spinal Block Patient position: sitting Prep: Betadine, ChloraPrep and site prepped and draped Patient monitoring: heart rate, continuous pulse ox, blood pressure and cardiac monitor Approach: midline Location: L4-5 Injection technique: single-shot Needle Needle type: Whitacre and Introducer  Needle gauge: 24 G Needle length: 9 cm Assessment Sensory level: T4 Events: CSF return Additional Notes Meticulous sterile technique used throughout (CHG prep, sterile gloves, sterile drape). Negative paresthesia. Negative blood return. Positive free-flowing CSF. Expiration date of kit checked and confirmed. Patient tolerated procedure well, without complications.

## 2022-08-06 NOTE — Interval H&P Note (Signed)
History and Physical Interval Note:  08/06/2022 6:11 AM  Kimberly Leon  has presented today for surgery, with the diagnosis of PRIMARY OSTEOARTHRITIS OF RIGHT HIP.Marland Kitchen  The various methods of treatment have been discussed with the patient and family. After consideration of risks, benefits and other options for treatment, the patient has consented to  Procedure(s): TOTAL HIP ARTHROPLASTY (Right) as a surgical intervention.  The patient's history has been reviewed, patient examined, no change in status, stable for surgery.  I have reviewed the patient's chart and labs.  Questions were answered to the patient's satisfaction.     Codie Hainer P Brookley Spitler

## 2022-08-06 NOTE — Anesthesia Preprocedure Evaluation (Signed)
Anesthesia Evaluation  Patient identified by MRN, date of birth, ID band Patient awake    Reviewed: Allergy & Precautions, NPO status , Patient's Chart, lab work & pertinent test results  Airway Mallampati: II  TM Distance: >3 FB Neck ROM: full    Dental  (+) Chipped   Pulmonary neg pulmonary ROS   Pulmonary exam normal        Cardiovascular negative cardio ROS Normal cardiovascular exam     Neuro/Psych negative neurological ROS  negative psych ROS   GI/Hepatic Neg liver ROS,GERD  Medicated,,  Endo/Other  negative endocrine ROS    Renal/GU      Musculoskeletal   Abdominal   Peds  Hematology negative hematology ROS (+)   Anesthesia Other Findings Past Medical History: No date: Allergic rhinitis No date: Arthritis 05/10/2019: Basal cell carcinoma     Comment:  forehead  No date: Cancer (HCC)     Comment:  skin ca-basal cell No date: Depression No date: Endometrial polyp No date: Family history of adverse reaction to anesthesia     Comment:  mother-- ponv No date: GERD (gastroesophageal reflux disease) No date: Pre-diabetes No date: Sinus congestion No date: Wears contact lenses     Comment:  not currently (03/16/21)  Past Surgical History: 1967: APPENDECTOMY 04/2016: BLEPHAROPLASTY; Bilateral     Comment:  upper eyelid 03/30/2021: CHEILECTOMY; Left     Comment:  Procedure: CHEILECTOMY;  Surgeon: Rosetta Posner, DPM;                Location: [facility];  Service: Podiatry;                Laterality: Left; 2014: COLONOSCOPY 08-24-2004   dr Eda Paschal [facility]: COMBINED HYSTEROSCOPY DIAGNOSTIC / D&C 06/03/2017: DILATATION & CURETTAGE/HYSTEROSCOPY WITH MYOSURE; N/A     Comment:  Procedure: DILATATION & CURETTAGE/HYSTEROSCOPY WITH               MYOSURE RESECTION OF ENDOMETRIAL POLYP;  Surgeon:               Dara Lords, MD;  Location: [facility];  Service:  Gynecology;  Laterality: N/A;  request               to follow 7:30am case inf [facility] block               time requests one hour 2016: EXCISION MORTON'S NEUROMA; Right 03/1998: FOOT SURGERY; Bilateral     Comment:  removal bone spurs 05/05/2020: KNEE ARTHROSCOPY; Right     Comment:  Procedure: ARTHROSCOPY KNEE;  Surgeon: Kennedy Bucker,               MD;  Location: [facility];  Service: Orthopedics;                Laterality: Right; No date: MOHS SURGERY 2010: ROTATOR CUFF REPAIR; Right yrs ago: TUBAL LIGATION  BMI    Body Mass Index: 25.97 kg/m      Reproductive/Obstetrics negative OB ROS                             Anesthesia Physical Anesthesia Plan  ASA: 2  Anesthesia Plan: Spinal   Post-op Pain Management:    Induction:   PONV Risk Score and Plan:   Airway Management Planned: Natural Airway and Nasal Cannula  Additional Equipment:  Intra-op Plan:   Post-operative Plan:   Informed Consent: I have reviewed the patients History and Physical, chart, labs and discussed the procedure including the risks, benefits and alternatives for the proposed anesthesia with the patient or authorized representative who has indicated his/her understanding and acceptance.     Dental Advisory Given  Plan Discussed with: Anesthesiologist, CRNA and Surgeon  Anesthesia Plan Comments: (Patient reports no bleeding problems and no anticoagulant use.  Plan for spinal with backup GA  Patient consented for risks of anesthesia including but not limited to:  - adverse reactions to medications - damage to eyes, teeth, lips or other oral mucosa - nerve damage due to positioning  - risk of bleeding, infection and or nerve damage from spinal that could lead to paralysis - risk of headache or failed spinal - damage to teeth, lips or other oral mucosa - sore throat or hoarseness - damage to heart, brain, nerves, lungs, other parts of body or loss of  life  Patient voiced understanding.)        Anesthesia Quick Evaluation

## 2022-08-06 NOTE — TOC Progression Note (Signed)
Transition of Care [facility]) - Progression Note    Patient Details  Name: Kimberly Leon MRN: [id number] Date of Birth: 03-Jul-1945  [facility] CM/SW Contact  Marlowe Sax, RN Phone Number: 08/06/2022, 3:59 PM  Clinical Narrative:    The patient was set up with [facility] prior to surgery by surgeons office with [facility]  pt has RW and BSC at home    Expected Discharge Plan: Home w Home Health Services Barriers to Discharge: No Barriers Identified  Expected Discharge Plan and Services       Living arrangements for the past 2 months: Single Family Home                 DME Arranged: N/A DME Agency: NA       HH Arranged: PT HH Agency: [facility] Date HH Agency Contacted: 08/06/22 Time HH Agency Contacted: 1556 Representative spoke with at [facility] Agency: [place]   Social Determinants of Health (SDOH) Interventions SDOH Screenings   Food Insecurity: No Food Insecurity (08/06/2022)  Housing: Low Risk  (08/06/2022)  Transportation Needs: No Transportation Needs (08/06/2022)  Utilities: Not At Risk (08/06/2022)  Tobacco Use: Low Risk  (08/06/2022)    Readmission Risk Interventions     No data to display

## 2022-08-07 ENCOUNTER — Encounter: Payer: Self-pay | Admitting: Orthopedic Surgery

## 2022-08-07 DIAGNOSIS — M1611 Unilateral primary osteoarthritis, right hip: Secondary | ICD-10-CM | POA: Diagnosis not present

## 2022-08-07 MED ORDER — ACETAMINOPHEN 10 MG/ML IV SOLN
INTRAVENOUS | Status: AC
  Filled 2022-08-07: qty 100

## 2022-08-07 MED ORDER — PANTOPRAZOLE SODIUM 40 MG PO TBEC
DELAYED_RELEASE_TABLET | ORAL | Status: AC
  Filled 2022-08-07: qty 1

## 2022-08-07 MED ORDER — METOCLOPRAMIDE HCL 10 MG PO TABS
ORAL_TABLET | ORAL | Status: AC
  Filled 2022-08-07: qty 1

## 2022-08-07 MED ORDER — LORATADINE 10 MG PO TABS
ORAL_TABLET | ORAL | Status: AC
  Filled 2022-08-07: qty 1

## 2022-08-07 MED ORDER — ENOXAPARIN SODIUM 40 MG/0.4ML IJ SOSY: 40.0000 mg | PREFILLED_SYRINGE | INTRAMUSCULAR | 0 refills | Status: DC

## 2022-08-07 MED ORDER — OXYCODONE HCL 5 MG PO TABS: 5.0000 mg | ORAL_TABLET | ORAL | 0 refills | Status: DC | PRN

## 2022-08-07 MED ORDER — SENNOSIDES-DOCUSATE SODIUM 8.6-50 MG PO TABS
ORAL_TABLET | ORAL | Status: AC
  Filled 2022-08-07: qty 1

## 2022-08-07 MED ORDER — TRAMADOL HCL 50 MG PO TABS: 50.0000 mg | ORAL_TABLET | ORAL | 0 refills | Status: DC | PRN

## 2022-08-07 MED ORDER — CELECOXIB 200 MG PO CAPS: 200.0000 mg | ORAL_CAPSULE | Freq: Two times a day (BID) | ORAL | 1 refills | Status: DC

## 2022-08-07 MED ORDER — MAGNESIUM HYDROXIDE 400 MG/5ML PO SUSP
ORAL | Status: AC
  Filled 2022-08-07: qty 30

## 2022-08-07 MED ORDER — ENOXAPARIN SODIUM 40 MG/0.4ML IJ SOSY
PREFILLED_SYRINGE | INTRAMUSCULAR | Status: AC
  Filled 2022-08-07: qty 0.4

## 2022-08-07 MED ORDER — TRAMADOL HCL 50 MG PO TABS
ORAL_TABLET | ORAL | Status: AC
  Filled 2022-08-07: qty 1

## 2022-08-07 MED ORDER — OXYCODONE HCL 5 MG PO TABS
ORAL_TABLET | ORAL | Status: AC
  Filled 2022-08-07: qty 2

## 2022-08-07 MED ORDER — CELECOXIB 200 MG PO CAPS
ORAL_CAPSULE | ORAL | Status: AC
  Filled 2022-08-07: qty 1

## 2022-08-07 NOTE — Discharge Summary (Signed)
Physician Discharge Summary  Subjective: 1 Day Post-Op Procedure(s) (LRB): TOTAL HIP ARTHROPLASTY (Right) Patient reports pain as mild.   Patient seen in rounds with Dr. Ernest Pine. Patient is well, and has had no acute complaints or problems Patient is ready to go home  Physician Discharge Summary  Patient ID: Kimberly Leon MRN: [id number] DOB/AGE: [id number] y.o.  Admit date: 08/06/2022 Discharge date: 08/07/2022  Admission Diagnoses:  Discharge Diagnoses:  Principal Problem:   Hx of total hip arthroplasty, right   Discharged Condition: good  Hospital Course: Patient presented on 08/06/2022 to the hospital for an elective right total hip arthroplasty procedure performed by Dr. Ernest Pine.  Patient was given TXA and Ancef preoperatively.  Patient tolerated the surgery well, see details of the surgery below.  Postoperatively the patient did well with therapy, she required having a few visits of therapy to pass her protocols.  Hip precautions were discussed at length with the patient.  Patient had minimal output into her JP drain which was pulled without any issue and was intact.  Patient had stable vital signs, and is in good health postsurgically.  Patient is stable for discharge home.  PROCEDURE:  Right total hip arthroplasty   SURGEON:  Jena Gauss. M.D.   ASSISTANT:  Gean Birchwood, PA-C (present and scrubbed throughout the case, critical for assistance with exposure, retraction, instrumentation, and closure)   ANESTHESIA: spinal   ESTIMATED BLOOD LOSS: 75 mL   FLUIDS REPLACED: 1250 mL of crystalloid   DRAINS: 2 medium Hemovac drains   IMPLANTS UTILIZED: DePuy size 2 high offset Actis femoral stem, 48 mm OD Pinnacle 100 acetabular component, +4 mm 10 degree Pinnacle Marathon polyethylene insert, and a 32 mm CoCr +1 mm hip ball  Treatments: None  Discharge Exam: Blood pressure (!) 112/46, pulse 69, temperature 98 F (36.7 C), temperature source Temporal, resp. rate  18, height 5\' 2"  (1.575 m), weight 64.4 kg, SpO2 94 %.   Disposition:    Allergies as of 08/07/2022   No Known Allergies      Medication List     STOP taking these medications    meloxicam 15 MG tablet Commonly known as: MOBIC       TAKE these medications    B-12 PO Take 2,000 mcg by mouth daily.   celecoxib 200 MG capsule Commonly known as: CELEBREX Take 1 capsule (200 mg total) by mouth 2 (two) times daily.   cetirizine 10 MG tablet Commonly known as: ZYRTEC Take 10 mg by mouth every morning.   citalopram 40 MG tablet Commonly known as: CELEXA Take 40 mg by mouth every morning.   Climara Pro 0.045-0.015 MG/DAY Generic drug: estradiol-levonorgestrel PLACE 1 PATCH ONTO THE SKIN ONCE A WEEK   clindamycin 1 % lotion Commonly known as: CLEOCIN T Apply 1 Application topically as needed.   Echinacea 400 MG Caps Take 1 capsule by mouth daily.   enoxaparin 40 MG/0.4ML injection Commonly known as: LOVENOX Inject 0.4 mLs (40 mg total) into the skin daily for 14 days.   fluorometholone 0.1 % ophthalmic suspension Commonly known as: FML 1 drop every 4 (four) hours.   hydrocortisone 2.5 % lotion APPLY TO AFFECTED AREAS EVERY OTHER DAY AS NEEDED What changed: See the new instructions.   neomycin-polymyxin b-dexamethasone 3.5-10000-0.1 Susp Commonly known as: MAXITROL Place 1 drop into the right eye as needed.   omeprazole 20 MG capsule Commonly known as: PRILOSEC Take 20 mg by mouth every morning.   OVER THE COUNTER  MEDICATION Take 1 tablet by mouth daily at 6 (six) AM. Theracumin   oxyCODONE 5 MG immediate release tablet Commonly known as: Oxy IR/ROXICODONE Take 1 tablet (5 mg total) by mouth every 4 (four) hours as needed for moderate pain (pain score 4-6).   traMADol 50 MG tablet Commonly known as: ULTRAM Take 1-2 tablets (50-100 mg total) by mouth every 4 (four) hours as needed for moderate pain.   traZODone 150 MG tablet Commonly known as:  DESYREL Take 150 mg by mouth at bedtime.   VITAMIN D PO Take 1,000 Units by mouth daily.               Durable Medical Equipment  (From admission, onward)           Start     Ordered   08/06/22 1224  DME Walker rolling  Once       Question:  Patient needs a walker to treat with the following condition  Answer:  S/P total hip arthroplasty   08/06/22 1223   08/06/22 1224  DME Bedside commode  Once       Comments: Patient is not able to walk the distance required to go the bathroom, or he/she is unable to safely negotiate stairs required to access the bathroom.  A 3in1 BSC will alleviate this problem  Question:  Patient needs a bedside commode to treat with the following condition  Answer:  S/P total hip arthroplasty   08/06/22 1223            Follow-up Information     Hooten, Illene Labrador, MD Follow up on 09/18/2022.   Specialty: Orthopedic Surgery Why: at 2:45pm Contact information: [address] [facility] [place] [address] [phone]                 Signed: Gean Birchwood 08/07/2022, 8:21 AM   Objective: Vital signs in last 24 hours: Temp:  [97 F (36.1 C)-98 F (36.7 C)] 98 F (36.7 C) (05/14 0438) Pulse Rate:  [59-76] 69 (05/14 0438) Resp:  [10-21] 18 (05/14 0438) BP: (107-126)/(46-89) 112/46 (05/14 0438) SpO2:  [92 %-97 %] 94 % (05/14 0438)  Intake/Output from previous day:  Intake/Output Summary (Last 24 hours) at 08/07/2022 0821 Last data filed at 08/07/2022 0534 Gross per 24 hour  Intake 2111.06 ml  Output 745 ml  Net 1366.06 ml    Intake/Output this shift: No intake/output data recorded.  Labs: No results for input(s): "HGB" in the last 72 hours. No results for input(s): "WBC", "RBC", "HCT", "PLT" in the last 72 hours. No results for input(s): "NA", "K", "CL", "CO2", "BUN", "CREATININE", "GLUCOSE", "CALCIUM" in the last 72 hours. No results for input(s): "LABPT", "INR" in the last 72 hours.  EXAM: General -  Patient is Alert, Appropriate, and Oriented Extremity - Neurologically intact ABD soft Neurovascular intact Sensation intact distally Intact pulses distally Dorsiflexion/Plantar flexion intact No cellulitis present Compartment soft Incision -incision site is covered by a Aquacel dressing.  There is minimal drainage appreciated on the actual Aquacel bandage.  There does not appear to be any surrounding swelling, erythema or pain with palpation around the bandage site. Motor Function -patient is able to dorsi and plantarflex her right foot without any issues, good strength and range of motion.  Patient able to straight leg raise with only mild pain reported in the anterior portion of her hip.  States that her pain has been better than she expected.  Patient is neurovascularly intact across  all lower leg dermatomes to sensation, pedal pulses appreciated (2+).  Assessment/Plan: 1 Day Post-Op Procedure(s) (LRB): TOTAL HIP ARTHROPLASTY (Right) Procedure(s) (LRB): TOTAL HIP ARTHROPLASTY (Right) Past Medical History:  Diagnosis Date   Allergic rhinitis    Arthritis    Basal cell carcinoma 05/10/2019   forehead    Cancer (HCC)    skin ca-basal cell   Depression    Endometrial polyp    Family history of adverse reaction to anesthesia    mother-- ponv   GERD (gastroesophageal reflux disease)    Pre-diabetes    Sinus congestion    Wears contact lenses    not currently (03/16/21)   Principal Problem:   Hx of total hip arthroplasty, right  Estimated body mass index is 25.97 kg/m as calculated from the following:   Height as of this encounter: 5\' 2"  (1.575 m).   Weight as of this encounter: 64.4 kg. Advance diet Up with therapy -continue to work with physical therapy on passing protocols for discharge   Patient will be sent home with Celebrex to help with pain and swelling, Lovenox for blood thinning medication, tramadol and oxycodone to help with as needed pain   Discussed utilizing  ice and pain medications at home before PT.   The Aquacel dressing will stay in place for 6 more days, and then be changed out with physical therapy at home     DVT Prophylaxis - Lovenox, TED hose, and SCDs. Will transition to ASA 81 mg BID after 14 days of lovenox Weight-Bearing as tolerated to right leg   JP drain pulled without issues, and intact Hip Preacutions   Follow-up with [facility] clinic orthopedics in 6 weeks be evaluated.  Patient will have staples removed at 2 weeks by physical therapy.   Activity - WBAT Disposition - Home Condition Upon Discharge - Good   Danise Edge, PA-C Orthopaedic Surgery 08/07/2022, 8:21 AM

## 2022-08-07 NOTE — Plan of Care (Signed)
  Problem: Activity: Goal: Ability to avoid complications of mobility impairment will improve Outcome: Progressing   Problem: Pain Management: Goal: Pain level will decrease with appropriate interventions Outcome: Progressing   Problem: Skin Integrity: Goal: Will show signs of wound healing Outcome: Progressing   

## 2022-08-07 NOTE — Anesthesia Postprocedure Evaluation (Signed)
Anesthesia Post Note  Patient: Kimberly Leon  Procedure(s) Performed: TOTAL HIP ARTHROPLASTY (Right: Hip)  Patient location: Pre-op Area room 19. Anesthesia Type: Spinal Level of consciousness: awake Pain management: pain level controlled Vital Signs Assessment: post-procedure vital signs reviewed and stable Respiratory status: spontaneous breathing Cardiovascular status: stable Postop Assessment: no headache Anesthetic complications: no Comments: Pt resting comfortably in bed. Vitals from 0438 stable.    No notable events documented.   Last Vitals:  Vitals:   08/06/22 2226 08/07/22 0438  BP: (!) 120/52 (!) 112/46  Pulse: 65 69  Resp: 18 18  Temp: (!) 36.3 C 36.7 C  SpO2: 95% 94%    Last Pain:  Vitals:   08/07/22 0654  TempSrc:   PainSc: Asleep                 Merlene Pulling

## 2022-08-07 NOTE — Progress Notes (Signed)
Subjective: 1 Day Post-Op Procedure(s) (LRB): TOTAL HIP ARTHROPLASTY (Right) Patient reports pain as mild.   Patient seen in rounds with Dr. Ernest Pine. Patient is well, and has had no acute complaints or problems We will continue with therapy today. Tolerated well after surgery on 5/13, however was documented still needed to work with them today to pass PT protocols safely. Patient felt she did well with PT Plan is to go Home after hospital stay.  Objective: Vital signs in last 24 hours: Temp:  [97 F (36.1 C)-98 F (36.7 C)] 98 F (36.7 C) (05/14 0438) Pulse Rate:  [59-76] 69 (05/14 0438) Resp:  [10-21] 18 (05/14 0438) BP: (107-126)/(46-89) 112/46 (05/14 0438) SpO2:  [92 %-97 %] 94 % (05/14 0438)  Intake/Output from previous day:  Intake/Output Summary (Last 24 hours) at 08/07/2022 0812 Last data filed at 08/07/2022 0534 Gross per 24 hour  Intake 2111.06 ml  Output 745 ml  Net 1366.06 ml    Intake/Output this shift: No intake/output data recorded.  Labs: No results for input(s): "HGB" in the last 72 hours. No results for input(s): "WBC", "RBC", "HCT", "PLT" in the last 72 hours. No results for input(s): "NA", "K", "CL", "CO2", "BUN", "CREATININE", "GLUCOSE", "CALCIUM" in the last 72 hours. No results for input(s): "LABPT", "INR" in the last 72 hours.  EXAM General - Patient is Alert, Appropriate, and Oriented Extremity - Neurologically intact ABD soft Neurovascular intact Sensation intact distally Intact pulses distally Dorsiflexion/Plantar flexion intact No cellulitis present Compartment soft Dressing - dressing C/D/I  Motor Function - intact, moving foot and toes well on exam.  Patient able to plantar and dorsiflex her foot with good strength and range of motion.  Patient able to straight leg raise with minimal pain at the anterior portion of her groin.  Patient is neurovascularly intact with sensation present in all dermatomes of her right lower leg.  Pedal pulses  appreciated (2+). JP drain pulled without difficulty and intact.   Past Medical History:  Diagnosis Date   Allergic rhinitis    Arthritis    Basal cell carcinoma 05/10/2019   forehead    Cancer (HCC)    skin ca-basal cell   Depression    Endometrial polyp    Family history of adverse reaction to anesthesia    mother-- ponv   GERD (gastroesophageal reflux disease)    Pre-diabetes    Sinus congestion    Wears contact lenses    not currently (03/16/21)    Assessment/Plan: 1 Day Post-Op Procedure(s) (LRB): TOTAL HIP ARTHROPLASTY (Right) Principal Problem:   Hx of total hip arthroplasty, right  Estimated body mass index is 25.97 kg/m as calculated from the following:   Height as of this encounter: 5\' 2"  (1.575 m).   Weight as of this encounter: 64.4 kg. Advance diet Up with therapy -continue to work with physical therapy on passing protocols for discharge   Patient will be sent home with Celebrex to help with pain and swelling, Lovenox for blood thinning medication, tramadol and oxycodone to help with as needed pain   Discussed utilizing ice and pain medications at home before PT.   The Aquacel dressing will stay in place for 6 more days, and then be changed out with physical therapy at home     DVT Prophylaxis - Lovenox, TED hose, and SCDs. Will transition to ASA 81 mg BID after 14 days of lovenox Weight-Bearing as tolerated to right leg  JP drain pulled without issues, and intact Hip Preacutions  Follow-up with [facility] clinic orthopedics in 6 weeks be evaluated.  Patient will have staples removed at 2 weeks by physical therapy.   Rayburn Go, PA-C [facility] 08/07/2022, 8:12 AM

## 2022-08-07 NOTE — Progress Notes (Signed)
Physical Therapy Treatment Patient Details Name: Kimberly Leon MRN: [id number] DOB: 1945-09-19 Today's Date: 08/07/2022   History of Present Illness Pt is a 77 yo female s/p R THA. PMH of skin cancer, GERD, foot surgery, shoulder surgery.    PT Comments    Patient alert, ambulating with OT upon PT arrival. Reported 7/10 R hip pain. She was able to ambulate ~274ft with RW and supervision, step through pattern but with some antalgic gait noted on RLE. She was able to perform stair navigation with CGA and verbal cues/demo for stair navigation, and pt able to teach back safe technique. Returned to room and in bed at pt request due to wanting to take a nap. The patient would benefit from further skilled PT intervention to continue to progress towards goals.     Recommendations for follow up therapy are one component of a multi-disciplinary discharge planning process, led by the attending physician.  Recommendations may be updated based on patient status, additional functional criteria and insurance authorization.  Follow Up Recommendations       Assistance Recommended at Discharge Intermittent Supervision/Assistance  Patient can return home with the following Assistance with cooking/housework;Assist for transportation;Help with stairs or ramp for entrance;A little help with bathing/dressing/bathroom   Equipment Recommendations  None recommended by PT (pt has RW and BSC at home)    Recommendations for Other Services       Precautions / Restrictions Precautions Precautions: Fall;Posterior Hip Precaution Booklet Issued: Yes (comment) Restrictions Weight Bearing Restrictions: Yes RLE Weight Bearing: Weight bearing as tolerated     Mobility  Bed Mobility               General bed mobility comments: NT    Transfers Overall transfer level: Needs assistance Equipment used: Rolling walker (2 wheels) Transfers: Sit to/from Stand Sit to Stand: Supervision                 Ambulation/Gait Ambulation/Gait assistance: Supervision Gait Distance (Feet): 200 Feet Assistive device: Rolling walker (2 wheels)         General Gait Details: step through pattern some antalgic gait on RLE noted   Stairs Stairs: Yes Stairs assistance: Min guard Stair Management: One rail Right, Step to pattern Number of Stairs: 4 General stair comments: cued for safe technique with good carryover   Wheelchair Mobility    Modified Rankin (Stroke Patients Only)       Balance Overall balance assessment: Needs assistance Sitting-balance support: Feet supported Sitting balance-Leahy Scale: Good     Standing balance support: Reliant on assistive device for balance Standing balance-Leahy Scale: Fair                              Cognition Arousal/Alertness: Awake/alert Behavior During Therapy: WFL for tasks assessed/performed Overall Cognitive Status: Within Functional Limits for tasks assessed                                          Exercises      General Comments        Pertinent Vitals/Pain Pain Assessment Pain Assessment: 0-10 Pain Score: 7  Pain Location: lateral, posterior hip Pain Descriptors / Indicators: Aching, Sharp, Grimacing Pain Intervention(s): Limited activity within patient's tolerance, Monitored during session, Premedicated before session, Repositioned, Ice applied    Home Living  Prior Function            PT Goals (current goals can now be found in the care plan section) Progress towards PT goals: Progressing toward goals    Frequency    BID      PT Plan Current plan remains appropriate    Co-evaluation              AM-PAC PT "6 Clicks" Mobility   Outcome Measure  Help needed turning from your back to your side while in a flat bed without using bedrails?: None Help needed moving from lying on your back to sitting on the side of a flat bed without  using bedrails?: None Help needed moving to and from a bed to a chair (including a wheelchair)?: None Help needed standing up from a chair using your arms (e.g., wheelchair or bedside chair)?: None Help needed to walk in hospital room?: None Help needed climbing 3-5 steps with a railing? : A Little 6 Click Score: 23    End of Session Equipment Utilized During Treatment: Gait belt Activity Tolerance: Patient tolerated treatment well Patient left: with call bell/phone within reach;in bed;with SCD's reapplied;with bed alarm set Nurse Communication: Mobility status PT Visit Diagnosis: Other abnormalities of gait and mobility (R26.89);Muscle weakness (generalized) (M62.81);Pain Pain - Right/Left: Right Pain - part of body: Hip     Time: 1610-9604 PT Time Calculation (min) (ACUTE ONLY): 9 min  Charges:  $Therapeutic Activity: 8-22 mins                     Olga Coaster PT, DPT 8:59 AM,08/07/22

## 2022-08-07 NOTE — Progress Notes (Signed)
DISCHARGE NOTE:  Pt given discharge instructions and verbalized understanding. TED hose on both legs. 2 extra honeycomb dressing sent with pt. Pt wheeled to car by staff, husband providing transportation.

## 2022-08-07 NOTE — Evaluation (Signed)
Occupational Therapy Evaluation Patient Details Name: Kimberly Leon MRN: [id number] DOB: 15-Apr-1945 Today's Date: 08/07/2022   History of Present Illness Pt is a 77 yo female s/p R THA. PMH of skin cancer, GERD, foot surgery, shoulder surgery.   Clinical Impression   Pt seen for OT evaluation this date, POD#1 from above surgery. Pt was independent in all ADL prior to surgery and is eager to return to PLOF with less pain and improved safety and independence including returning to tennis and pickleball. Pt currently requires PRN MIN assist for LB dressing and bathing while in seated position due to pain and limited AROM of R hip. Pt reports spouse able to provide needed level of assist. Pt able to recall 3/3 posterior total hip precautions at start of session. Pt instructed in posterior total hip precautions and how to implement, self care skills, falls prevention strategies, home/routines modifications, DME/AE for LB bathing and dressing tasks, compression stocking mgt strategies, and ADL mobility techniques. Pt required supv-SBA for ADL mobility with RW and PRN VC for technique while turning to maintain precautions. Do not anticipate additional skilled OT needs at this time. Pt in agreement. Will sign off.        Recommendations for follow up therapy are one component of a multi-disciplinary discharge planning process, led by the attending physician.  Recommendations may be updated based on patient status, additional functional criteria and insurance authorization.   Assistance Recommended at Discharge PRN  Patient can return home with the following A little help with bathing/dressing/bathroom;Assistance with cooking/housework;Assist for transportation;Help with stairs or ramp for entrance    Functional Status Assessment  Patient has had a recent decline in their functional status and demonstrates the ability to make significant improvements in function in a reasonable and predictable amount of  time.  Equipment Recommendations  None recommended by OT    Recommendations for Other Services       Precautions / Restrictions Precautions Precautions: Fall;Posterior Hip Precaution Booklet Issued: Yes (comment) Restrictions Weight Bearing Restrictions: Yes RLE Weight Bearing: Weight bearing as tolerated      Mobility Bed Mobility               General bed mobility comments: NT    Transfers Overall transfer level: Needs assistance Equipment used: Rolling walker (2 wheels) Transfers: Sit to/from Stand Sit to Stand: Supervision                  Balance Overall balance assessment: Mild deficits observed, not formally tested                                         ADL either performed or assessed with clinical judgement   ADL                                         General ADL Comments: PRN MIN A for LB ADL, spouse able to assist     Vision         Perception     Praxis      Pertinent Vitals/Pain Pain Assessment Pain Assessment: 0-10 Pain Score: 7  Pain Location: lateral, posterior hip Pain Descriptors / Indicators: Aching Pain Intervention(s): Limited activity within patient's tolerance, Monitored during session, Repositioned, Ice applied, RN gave pain meds during session  Hand Dominance     Extremity/Trunk Assessment Upper Extremity Assessment Upper Extremity Assessment: Overall WFL for tasks assessed   Lower Extremity Assessment Lower Extremity Assessment: RLE deficits/detail RLE Deficits / Details: expected post-op strength/ROM deficits   Cervical / Trunk Assessment Cervical / Trunk Assessment: Normal   Communication Communication Communication: No difficulties   Cognition Arousal/Alertness: Awake/alert Behavior During Therapy: WFL for tasks assessed/performed Overall Cognitive Status: Within Functional Limits for tasks assessed                                        General Comments       Exercises Other Exercises Other Exercises: Pt educated in falls prevention, AE/DME, hip precautions, compression stocking mgt   Shoulder Instructions      Home Living Family/patient expects to be discharged to:: Private residence Living Arrangements: Spouse/significant other Available Help at Discharge: Family Type of Home: House Home Access: Stairs to enter Secretary/administrator of Steps: 6 Entrance Stairs-Rails: Left;Right Home Layout: One level     Bathroom Shower/Tub: Producer, television/film/video: Standard     Home Equipment: Teacher, English as a foreign language (2 wheels);Shower seat;Shower seat - built in          Prior Functioning/Environment Prior Level of Function : Independent/Modified Independent                        OT Problem List: Decreased strength;Decreased range of motion;Pain      OT Treatment/Interventions:      OT Goals(Current goals can be found in the care plan section) Acute Rehab OT Goals Patient Stated Goal: get back to tennis and pickleball OT Goal Formulation: All assessment and education complete, DC therapy  OT Frequency:      Co-evaluation              AM-PAC OT "6 Clicks" Daily Activity     Outcome Measure Help from another person eating meals?: None Help from another person taking care of personal grooming?: None Help from another person toileting, which includes using toliet, bedpan, or urinal?: A Little Help from another person bathing (including washing, rinsing, drying)?: A Little Help from another person to put on and taking off regular upper body clothing?: None Help from another person to put on and taking off regular lower body clothing?: A Little 6 Click Score: 21   End of Session Equipment Utilized During Treatment: Gait belt;Rolling walker (2 wheels)  Activity Tolerance: Patient tolerated treatment well Patient left: Other (comment) (ambulating wiht PT in hall at end of OT  session)  OT Visit Diagnosis: Other abnormalities of gait and mobility (R26.89);Pain Pain - Right/Left: Right Pain - part of body: Hip                Time: 1610-9604 OT Time Calculation (min): 15 min Charges:  OT General Charges $OT Visit: 1 Visit OT Evaluation $OT Eval Low Complexity: 1 Low OT Treatments $Self Care/Home Management : 8-22 mins  Arman Filter., MPH, MS, OTR/L ascom [phone] 08/07/22, 9:21 AM

## 2022-08-08 DIAGNOSIS — Z780 Asymptomatic menopausal state: Secondary | ICD-10-CM | POA: Diagnosis not present

## 2022-08-08 DIAGNOSIS — F32A Depression, unspecified: Secondary | ICD-10-CM | POA: Diagnosis not present

## 2022-08-08 DIAGNOSIS — K219 Gastro-esophageal reflux disease without esophagitis: Secondary | ICD-10-CM | POA: Diagnosis not present

## 2022-08-08 DIAGNOSIS — R7303 Prediabetes: Secondary | ICD-10-CM | POA: Diagnosis not present

## 2022-08-08 DIAGNOSIS — Z96641 Presence of right artificial hip joint: Secondary | ICD-10-CM | POA: Diagnosis not present

## 2022-08-08 DIAGNOSIS — J302 Other seasonal allergic rhinitis: Secondary | ICD-10-CM | POA: Diagnosis not present

## 2022-08-08 DIAGNOSIS — Z791 Long term (current) use of non-steroidal anti-inflammatories (NSAID): Secondary | ICD-10-CM | POA: Diagnosis not present

## 2022-08-08 DIAGNOSIS — Z9181 History of falling: Secondary | ICD-10-CM | POA: Diagnosis not present

## 2022-08-08 DIAGNOSIS — Z85828 Personal history of other malignant neoplasm of skin: Secondary | ICD-10-CM | POA: Diagnosis not present

## 2022-08-08 DIAGNOSIS — Z471 Aftercare following joint replacement surgery: Secondary | ICD-10-CM | POA: Diagnosis not present

## 2022-08-08 LAB — SURGICAL PATHOLOGY

## 2022-08-20 DIAGNOSIS — Z471 Aftercare following joint replacement surgery: Secondary | ICD-10-CM | POA: Diagnosis not present

## 2022-08-23 ENCOUNTER — Ambulatory Visit: Payer: PPO | Admitting: Dermatology

## 2022-08-28 DIAGNOSIS — Z96641 Presence of right artificial hip joint: Secondary | ICD-10-CM | POA: Diagnosis not present

## 2022-08-28 DIAGNOSIS — J302 Other seasonal allergic rhinitis: Secondary | ICD-10-CM | POA: Diagnosis not present

## 2022-08-28 DIAGNOSIS — K219 Gastro-esophageal reflux disease without esophagitis: Secondary | ICD-10-CM | POA: Diagnosis not present

## 2022-08-28 DIAGNOSIS — Z471 Aftercare following joint replacement surgery: Secondary | ICD-10-CM | POA: Diagnosis not present

## 2022-08-28 DIAGNOSIS — Z791 Long term (current) use of non-steroidal anti-inflammatories (NSAID): Secondary | ICD-10-CM | POA: Diagnosis not present

## 2022-08-28 DIAGNOSIS — F32A Depression, unspecified: Secondary | ICD-10-CM | POA: Diagnosis not present

## 2022-08-28 DIAGNOSIS — Z780 Asymptomatic menopausal state: Secondary | ICD-10-CM | POA: Diagnosis not present

## 2022-08-28 DIAGNOSIS — Z85828 Personal history of other malignant neoplasm of skin: Secondary | ICD-10-CM | POA: Diagnosis not present

## 2022-08-28 DIAGNOSIS — Z9181 History of falling: Secondary | ICD-10-CM | POA: Diagnosis not present

## 2022-08-28 DIAGNOSIS — R7303 Prediabetes: Secondary | ICD-10-CM | POA: Diagnosis not present

## 2022-09-18 DIAGNOSIS — Z96641 Presence of right artificial hip joint: Secondary | ICD-10-CM | POA: Diagnosis not present

## 2022-10-23 ENCOUNTER — Other Ambulatory Visit: Payer: Self-pay | Admitting: Nurse Practitioner

## 2022-10-23 DIAGNOSIS — Z79890 Hormone replacement therapy (postmenopausal): Secondary | ICD-10-CM

## 2022-10-23 NOTE — Telephone Encounter (Signed)
Med refill request: Climara Last AEX: 02/21/21 Next AEX: not scheduled Last MMG (if hormonal med) 04/04/20 Refill authorized: Please Advise, last note from 06/15/22 gave 3 mo for refills and then requires OV. Denying and asking front desk to schedule

## 2022-12-03 ENCOUNTER — Other Ambulatory Visit: Payer: Self-pay | Admitting: Nurse Practitioner

## 2022-12-03 DIAGNOSIS — Z79890 Hormone replacement therapy (postmenopausal): Secondary | ICD-10-CM

## 2022-12-03 NOTE — Telephone Encounter (Signed)
Med refill request: Climara Pro Last AEX: 02/21/21 Next AEX: 02/26/23 Last MMG (if hormonal med) 04/04/20 Refill authorized: Please Advise?

## 2022-12-24 DIAGNOSIS — M19011 Primary osteoarthritis, right shoulder: Secondary | ICD-10-CM | POA: Diagnosis not present

## 2022-12-24 DIAGNOSIS — M1711 Unilateral primary osteoarthritis, right knee: Secondary | ICD-10-CM | POA: Diagnosis not present

## 2022-12-31 DIAGNOSIS — R7303 Prediabetes: Secondary | ICD-10-CM | POA: Diagnosis not present

## 2023-01-02 DIAGNOSIS — M25511 Pain in right shoulder: Secondary | ICD-10-CM | POA: Diagnosis not present

## 2023-01-02 DIAGNOSIS — M25561 Pain in right knee: Secondary | ICD-10-CM | POA: Diagnosis not present

## 2023-01-07 DIAGNOSIS — R7303 Prediabetes: Secondary | ICD-10-CM | POA: Diagnosis not present

## 2023-01-07 DIAGNOSIS — Z0000 Encounter for general adult medical examination without abnormal findings: Secondary | ICD-10-CM | POA: Diagnosis not present

## 2023-01-07 DIAGNOSIS — Z23 Encounter for immunization: Secondary | ICD-10-CM | POA: Diagnosis not present

## 2023-01-07 DIAGNOSIS — F325 Major depressive disorder, single episode, in full remission: Secondary | ICD-10-CM | POA: Diagnosis not present

## 2023-01-10 ENCOUNTER — Ambulatory Visit: Payer: PPO | Admitting: Dermatology

## 2023-01-10 ENCOUNTER — Encounter: Payer: Self-pay | Admitting: Dermatology

## 2023-01-10 DIAGNOSIS — D1801 Hemangioma of skin and subcutaneous tissue: Secondary | ICD-10-CM | POA: Diagnosis not present

## 2023-01-10 DIAGNOSIS — Z1283 Encounter for screening for malignant neoplasm of skin: Secondary | ICD-10-CM | POA: Diagnosis not present

## 2023-01-10 DIAGNOSIS — Z85828 Personal history of other malignant neoplasm of skin: Secondary | ICD-10-CM | POA: Diagnosis not present

## 2023-01-10 DIAGNOSIS — L820 Inflamed seborrheic keratosis: Secondary | ICD-10-CM

## 2023-01-10 DIAGNOSIS — L821 Other seborrheic keratosis: Secondary | ICD-10-CM | POA: Diagnosis not present

## 2023-01-10 DIAGNOSIS — L814 Other melanin hyperpigmentation: Secondary | ICD-10-CM | POA: Diagnosis not present

## 2023-01-10 DIAGNOSIS — D229 Melanocytic nevi, unspecified: Secondary | ICD-10-CM

## 2023-01-10 DIAGNOSIS — W908XXA Exposure to other nonionizing radiation, initial encounter: Secondary | ICD-10-CM

## 2023-01-10 DIAGNOSIS — L578 Other skin changes due to chronic exposure to nonionizing radiation: Secondary | ICD-10-CM | POA: Diagnosis not present

## 2023-01-10 NOTE — Patient Instructions (Addendum)
Cryotherapy Aftercare  Wash gently with soap and water everyday.   Apply Vaseline Jelly daily until healed.    Recommend daily broad spectrum sunscreen SPF 30+ to sun-exposed areas, reapply every 2 hours as needed. Call for new or changing lesions.  Staying in the shade or wearing long sleeves, sun glasses (UVA+UVB protection) and wide brim hats (4-inch brim around the entire circumference of the hat) are also recommended for sun protection.    Melanoma ABCDEs  Melanoma is the most dangerous type of skin cancer, and is the leading cause of death from skin disease.  You are more likely to develop melanoma if you: Have light-colored skin, light-colored eyes, or red or blond hair Spend a lot of time in the sun Tan regularly, either outdoors or in a tanning bed Have had blistering sunburns, especially during childhood Have a close family member who has had a melanoma Have atypical moles or large birthmarks  Early detection of melanoma is key since treatment is typically straightforward and cure rates are extremely high if we catch it early.   The first sign of melanoma is often a change in a mole or a new dark spot.  The ABCDE system is a way of remembering the signs of melanoma.  A for asymmetry:  The two halves do not match. B for border:  The edges of the growth are irregular. C for color:  A mixture of colors are present instead of an even brown color. D for diameter:  Melanomas are usually (but not always) greater than 6mm - the size of a pencil eraser. E for evolution:  The spot keeps changing in size, shape, and color.  Please check your skin once per month between visits. You can use a small mirror in front and a large mirror behind you to keep an eye on the back side or your body.   If you see any new or changing lesions before your next follow-up, please call to schedule a visit.  Please continue daily skin protection including broad spectrum sunscreen SPF 30+ to sun-exposed  areas, reapplying every 2 hours as needed when you're outdoors.   Staying in the shade or wearing long sleeves, sun glasses (UVA+UVB protection) and wide brim hats (4-inch brim around the entire circumference of the hat) are also recommended for sun protection.     Due to recent changes in healthcare laws, you may see results of your pathology and/or laboratory studies on MyChart before the doctors have had a chance to review them. We understand that in some cases there may be results that are confusing or concerning to you. Please understand that not all results are received at the same time and often the doctors may need to interpret multiple results in order to provide you with the best plan of care or course of treatment. Therefore, we ask that you please give [place] 2 business days to thoroughly review all your results before contacting the office for clarification. Should we see a critical lab result, you will be contacted sooner.   If You Need Anything After Your Visit  If you have any questions or concerns for your doctor, please call our main line at [phone] and press option 4 to reach your doctor's medical assistant. If no one answers, please leave a voicemail as directed and we will return your call as soon as possible. Messages left after 4 pm will be answered the following business day.   You may also send [place] a message via  MyChart. We typically respond to MyChart messages within 1-2 business days.  For prescription refills, please ask your pharmacy to contact our office. Our fax number is [phone].  If you have an urgent issue when the clinic is closed that cannot wait until the next business day, you can page your doctor at the number below.    Please note that while we do our best to be available for urgent issues outside of office hours, we are not available 24/7.   If you have an urgent issue and are unable to reach [place], you may choose to seek medical care at your doctor's  office, retail clinic, urgent care center, or emergency room.  If you have a medical emergency, please immediately call 911 or go to the emergency department.  Pager Numbers  - Dr. Gwen Pounds: [phone]  - Dr. Roseanne Reno: [phone]  - Dr. Katrinka Blazing: [phone]   In the event of inclement weather, please call our main line at [phone] for an update on the status of any delays or closures.  Dermatology Medication Tips: Please keep the boxes that topical medications come in in order to help keep track of the instructions about where and how to use these. Pharmacies typically print the medication instructions only on the boxes and not directly on the medication tubes.   If your medication is too expensive, please contact our office at [phone] option 4 or send [place] a message through MyChart.   We are unable to tell what your co-pay for medications will be in advance as this is different depending on your insurance coverage. However, we may be able to find a substitute medication at lower cost or fill out paperwork to get insurance to cover a needed medication.   If a prior authorization is required to get your medication covered by your insurance company, please allow [place] 1-2 business days to complete this process.  Drug prices often vary depending on where the prescription is filled and some pharmacies may offer cheaper prices.  The website www.goodrx.com contains coupons for medications through different pharmacies. The prices here do not account for what the cost may be with help from insurance (it may be cheaper with your insurance), but the website can give you the price if you did not use any insurance.  - You can print the associated coupon and take it with your prescription to the pharmacy.  - You may also stop by our office during regular business hours and pick up a GoodRx coupon card.  - If you need your prescription sent electronically to a different pharmacy, notify our  office through [facility] or by phone at [phone] option 4.     Si Usted Necesita Algo Despus de Su Visita  Tambin puede enviarnos un mensaje a travs de Clinical cytogeneticist. Por lo general respondemos a los mensajes de MyChart en el transcurso de 1 a 2 das hbiles.  Para renovar recetas, por favor pida a su farmacia que se ponga en contacto con nuestra oficina. Annie Sable de fax es [place] [phone].  Si tiene un asunto urgente cuando la clnica est cerrada y que no puede esperar hasta el siguiente da hbil, puede llamar/localizar a su doctor(a) al nmero que aparece a continuacin.   Por favor, tenga en cuenta que aunque hacemos todo lo posible para estar disponibles para asuntos urgentes fuera del horario de [place], no estamos disponibles las 24 horas del da, los 7 [address]  [address] de la [place].   Si tiene un problema urgente y  no puede comunicarse con nosotros, puede optar por buscar atencin mdica  en el consultorio de su doctor(a), en una clnica privada, en un centro de atencin urgente o en una sala de emergencias.  Si tiene Engineer, drilling, por favor llame inmediatamente al 911 o vaya a la sala de emergencias.  Nmeros de bper  - Dr. Gwen Pounds: [phone]  - Dra. Roseanne Reno: [phone]  - Dr. Katrinka Blazing: [phone]   En caso de inclemencias del tiempo, por favor llame a Lacy Duverney principal al [phone] para una actualizacin sobre el [place] de cualquier retraso o cierre.  Consejos para la medicacin en dermatologa: Por favor, guarde las cajas en las que vienen los medicamentos de uso tpico para ayudarle a seguir las instrucciones sobre dnde y cmo usarlos. Las farmacias generalmente imprimen las instrucciones del medicamento slo en las cajas y no directamente en los tubos del [place].   Si su medicamento es muy caro, por favor, pngase en contacto con Rolm Gala llamando al [phone] y presione la opcin 4 o envenos un mensaje a travs de Clinical cytogeneticist.    No podemos decirle cul ser su copago por los medicamentos por adelantado ya que esto es diferente dependiendo de la cobertura de su seguro. Sin embargo, es posible que podamos encontrar un medicamento sustituto a Audiological scientist un formulario para que el seguro cubra el medicamento que se considera necesario.   Si se requiere una autorizacin previa para que su compaa de seguros [place] su medicamento, por favor permtanos de 1 a 2 das hbiles para completar [address]  Los precios de los medicamentos varan con frecuencia dependiendo del Environmental consultant de dnde se surte la receta y alguna farmacias pueden ofrecer precios ms baratos.  El sitio web www.goodrx.com tiene cupones para medicamentos de Health and safety inspector. Los precios aqu no tienen en cuenta lo que podra costar con la ayuda del seguro (puede ser ms barato con su seguro), pero el sitio web puede darle el precio si no utiliz Tourist information centre manager.  - Puede imprimir el cupn correspondiente y llevarlo con su receta a la farmacia.  - Tambin puede pasar por nuestra oficina durante el horario de atencin regular y Education officer, museum una tarjeta de cupones de GoodRx.  - Si necesita que su receta se enve electrnicamente a una farmacia diferente, informe a nuestra oficina a travs de MyChart de [place] o por telfono llamando al [phone] y presione la opcin 4.

## 2023-01-10 NOTE — Progress Notes (Signed)
Follow-Up Visit   Subjective  Kimberly Leon is a 77 y.o. female who presents for the following: Skin Cancer Screening and Full Body Skin Exam. HxBCC  Bump on face of concern.   The patient presents for Total-Body Skin Exam (TBSE) for skin cancer screening and mole check. The patient has spots, moles and lesions to be evaluated, some may be new or changing and the patient may have concern these could be cancer.    The following portions of the chart were reviewed this encounter and updated as appropriate: medications, allergies, medical history  Review of Systems:  No other skin or systemic complaints except as noted in HPI or Assessment and Plan.  Objective  Well appearing patient in no apparent distress; mood and affect are within normal limits.  A full examination was performed including scalp, head, eyes, ears, nose, lips, neck, chest, axillae, abdomen, back, buttocks, bilateral upper extremities, bilateral lower extremities, hands, feet, fingers, toes, fingernails, and toenails. All findings within normal limits unless otherwise noted below.   Relevant physical exam findings are noted in the Assessment and Plan.  Right Sideburn area x1, R distal deltoid x1, L calf x1 (3) Erythematous keratotic or waxy stuck-on papule or plaque.    Assessment & Plan   HISTORY OF BASAL CELL CARCINOMA OF THE SKIN. Forehead. 05/10/2019. - No evidence of recurrence today - Recommend regular full body skin exams - Recommend daily broad spectrum sunscreen SPF 30+ to sun-exposed areas, reapply every 2 hours as needed.  - Call if any new or changing lesions are noted between office visits   SKIN CANCER SCREENING PERFORMED TODAY.  ACTINIC DAMAGE - Chronic condition, secondary to cumulative UV/sun exposure - diffuse scaly erythematous macules with underlying dyspigmentation - Recommend daily broad spectrum sunscreen SPF 30+ to sun-exposed areas, reapply every 2 hours as needed.  - Staying in  the shade or wearing long sleeves, sun glasses (UVA+UVB protection) and wide brim hats (4-inch brim around the entire circumference of the hat) are also recommended for sun protection.  - Call for new or changing lesions.  LENTIGINES, SEBORRHEIC KERATOSES, HEMANGIOMAS - Benign normal skin lesions - Benign-appearing - Call for any changes  MELANOCYTIC NEVI - Tan-brown and/or pink-flesh-colored symmetric macules and papules - Benign appearing on exam today - Observation - Call clinic for new or changing moles - Recommend daily use of broad spectrum spf 30+ sunscreen to sun-exposed areas.       Inflamed seborrheic keratosis (3) Right Sideburn area x1, R distal deltoid x1, L calf x1  Symptomatic, irritating, patient would like treated.  Destruction of lesion - Right Sideburn area x1, R distal deltoid x1, L calf x1 (3) Complexity: simple   Destruction method: cryotherapy   Informed consent: discussed and consent obtained   Timeout:  patient name, date of birth, surgical site, and procedure verified Lesion destroyed using liquid nitrogen: Yes   Region frozen until ice ball extended beyond lesion: Yes   Outcome: patient tolerated procedure well with no complications   Post-procedure details: wound care instructions given   Additional details:  Prior to procedure, discussed risks of blister formation, small wound, skin dyspigmentation, or rare scar following cryotherapy. Recommend Vaseline ointment to treated areas while healing.    Return in about 1 year (around 01/10/2024) for TBSE, HxBCC.  I, Lawson Radar, CMA, am acting as scribe for Armida Sans, MD.   Documentation: I have reviewed the above documentation for accuracy and completeness, and I agree with the above.  Armida Sans, MD

## 2023-01-14 ENCOUNTER — Telehealth: Payer: Self-pay

## 2023-01-14 NOTE — Telephone Encounter (Signed)
Patient called she has a blister from a LN2 procedure she had a few days ago, advised patient blisters are normal apply plain Vaseline and cover with a bad aid

## 2023-01-23 DIAGNOSIS — H25813 Combined forms of age-related cataract, bilateral: Secondary | ICD-10-CM | POA: Diagnosis not present

## 2023-02-11 DIAGNOSIS — M545 Low back pain, unspecified: Secondary | ICD-10-CM | POA: Diagnosis not present

## 2023-02-15 DIAGNOSIS — H903 Sensorineural hearing loss, bilateral: Secondary | ICD-10-CM | POA: Diagnosis not present

## 2023-02-26 ENCOUNTER — Ambulatory Visit (INDEPENDENT_AMBULATORY_CARE_PROVIDER_SITE_OTHER): Payer: PPO | Admitting: Nurse Practitioner

## 2023-02-26 ENCOUNTER — Encounter: Payer: Self-pay | Admitting: Nurse Practitioner

## 2023-02-26 VITALS — BP 110/70 | HR 78 | Ht 61.42 in | Wt 147.0 lb

## 2023-02-26 DIAGNOSIS — N952 Postmenopausal atrophic vaginitis: Secondary | ICD-10-CM | POA: Diagnosis not present

## 2023-02-26 DIAGNOSIS — Z01419 Encounter for gynecological examination (general) (routine) without abnormal findings: Secondary | ICD-10-CM

## 2023-02-26 DIAGNOSIS — Z79890 Hormone replacement therapy (postmenopausal): Secondary | ICD-10-CM | POA: Diagnosis not present

## 2023-02-26 DIAGNOSIS — Z780 Asymptomatic menopausal state: Secondary | ICD-10-CM

## 2023-02-26 MED ORDER — CLIMARA PRO 0.045-0.015 MG/DAY TD PTWK
1.0000 | MEDICATED_PATCH | TRANSDERMAL | 4 refills | Status: AC
Start: 2023-02-26 — End: ?

## 2023-02-26 NOTE — Progress Notes (Signed)
Kimberly Leon 1946/03/14 [id number]   History:  77 y.o. G2P2002 presents for breast and pelvic exam. Postmenopausal - on HRT. Had episode of bleeding last year when she forgot to change her patch. U/S 10/2021 showed thin endometrial lining. No bleeding since. Normal pap history. Benign breast biopsy in 2022.  Gynecologic History No LMP recorded. Patient is postmenopausal.   Contraception: post menopausal status Sexually active: Yes  Health Maintenance Last Pap: 12/03/2019. Results were: Normal Last mammogram: 03/2022. Results were: Normal Last colonoscopy: 2014. Results were: Normal, 10-year recall Last Dexa: 04/11/2017. Results were: Normal  Past medical history, past surgical history, family history and social history were all reviewed and documented in the EPIC chart. Married. Used to own car dealership. 2 children in [place], grandchildren ages 12-21.  ROS:  A ROS was performed and pertinent positives and negatives are included.  Exam:  Vitals:   02/26/23 1340  BP: 110/70  Pulse: 78  SpO2: 98%  Weight: 147 lb (66.7 kg)  Height: 5' 1.42" (1.56 m)    Body mass index is 27.4 kg/m.  General appearance:  Normal Thyroid:  Symmetrical, normal in size, without palpable masses or nodularity. Respiratory  Auscultation:  Clear without wheezing or rhonchi Cardiovascular  Auscultation:  Regular rate, without rubs, murmurs or gallops  Edema/varicosities:  Not grossly evident Abdominal  Soft,nontender, without masses, guarding or rebound.  Liver/spleen:  No organomegaly noted  Hernia:  None appreciated  Skin  Inspection:  Grossly normal Breasts: Examined lying and sitting.   Right: Without masses, retractions, nipple discharge or axillary adenopathy.   Left: Without masses, retractions, nipple discharge or axillary adenopathy. Pelvic: External genitalia:  no lesions              Urethra:  normal appearing urethra with no masses, tenderness or lesions               Bartholins and Skenes: normal                 Vagina: normal appearing vagina with normal color and discharge, no lesions. Atrophic changes              Cervix: no lesions Bimanual Exam:  Uterus:  no masses or tenderness              Adnexa: no mass, fullness, tenderness              Rectovaginal: Deferred              Anus:  normal, no lesions  Patient informed chaperone available to be present for breast and pelvic exam. Patient has requested no chaperone to be present. Patient has been advised what will be completed during breast and pelvic exam.   Assessment/Plan:  77 y.o. G2P2002 for breast and pelvic exam.   Encounter for breast and pelvic examination - Education provided on SBEs, importance of preventative screenings, current guidelines, high calcium diet, regular exercise, and multivitamin daily.  Labs with PCP.   Postmenopausal - Plan: DG Bone Density. On HRT, no bleeding. [facility]s [facility].   Postmenopausal hormone therapy - Plan: estradiol-levonorgestrel (CLIMARA PRO) 0.045-0.015 MG/DAY weekly. She has tried to wean in the past but does not tolerate. She is aware of risks with continued use. Refill x 1 year provided.   Screening for cervical cancer - Normal Pap history. No longer screening per guidelines.   Screening for breast cancer - Had benign biopsy and clip placement of right breast 03/2020. Continue  annual screenings.  Normal breast exam today.  Screening for colon cancer - 2014 colonoscopy. Will repeat at GI's recommended interval.   Screening for osteoporosis - Normal bone density in 2019. Repeat DXA.  Return in about 1 year (around 02/26/2024) for Med follow up.    Olivia Mackie DNP, 2:01 PM 02/26/2023

## 2023-03-06 DIAGNOSIS — H2512 Age-related nuclear cataract, left eye: Secondary | ICD-10-CM | POA: Diagnosis not present

## 2023-03-06 DIAGNOSIS — H2511 Age-related nuclear cataract, right eye: Secondary | ICD-10-CM | POA: Diagnosis not present

## 2023-03-06 DIAGNOSIS — Z01818 Encounter for other preprocedural examination: Secondary | ICD-10-CM | POA: Diagnosis not present

## 2023-03-06 DIAGNOSIS — H52223 Regular astigmatism, bilateral: Secondary | ICD-10-CM | POA: Diagnosis not present

## 2023-03-29 DIAGNOSIS — H2511 Age-related nuclear cataract, right eye: Secondary | ICD-10-CM | POA: Diagnosis not present

## 2023-03-29 DIAGNOSIS — F419 Anxiety disorder, unspecified: Secondary | ICD-10-CM | POA: Diagnosis not present

## 2023-03-29 DIAGNOSIS — H2512 Age-related nuclear cataract, left eye: Secondary | ICD-10-CM | POA: Diagnosis not present

## 2023-04-03 ENCOUNTER — Telehealth: Payer: Self-pay

## 2023-04-03 MED ORDER — MOMETASONE FUROATE 0.1 % EX SOLN: CUTANEOUS | 0 refills | Status: AC

## 2023-04-03 MED ORDER — HYDROCORTISONE 2.5 % EX LOTN: 1.0000 | TOPICAL_LOTION | CUTANEOUS | 0 refills | Status: AC

## 2023-04-03 NOTE — Telephone Encounter (Signed)
 Patient called and requested Rfs of Hydrocortisone and Mometasone for ears. 1 RF sent in. aw

## 2023-04-12 DIAGNOSIS — H2511 Age-related nuclear cataract, right eye: Secondary | ICD-10-CM | POA: Diagnosis not present

## 2023-04-12 DIAGNOSIS — F418 Other specified anxiety disorders: Secondary | ICD-10-CM | POA: Diagnosis not present

## 2023-04-12 DIAGNOSIS — H2512 Age-related nuclear cataract, left eye: Secondary | ICD-10-CM | POA: Diagnosis not present

## 2023-04-29 DIAGNOSIS — Z1231 Encounter for screening mammogram for malignant neoplasm of breast: Secondary | ICD-10-CM | POA: Diagnosis not present

## 2023-05-30 DIAGNOSIS — M25561 Pain in right knee: Secondary | ICD-10-CM | POA: Diagnosis not present

## 2023-06-10 DIAGNOSIS — M25511 Pain in right shoulder: Secondary | ICD-10-CM | POA: Diagnosis not present

## 2023-06-20 DIAGNOSIS — M7061 Trochanteric bursitis, right hip: Secondary | ICD-10-CM | POA: Diagnosis not present

## 2023-06-20 DIAGNOSIS — M25561 Pain in right knee: Secondary | ICD-10-CM | POA: Diagnosis not present

## 2023-06-20 DIAGNOSIS — Z96641 Presence of right artificial hip joint: Secondary | ICD-10-CM | POA: Diagnosis not present

## 2023-06-21 ENCOUNTER — Ambulatory Visit
Admission: RE | Admit: 2023-06-21 | Discharge: 2023-06-21 | Disposition: A | Discharge status: Home / Self Care | Payer: Self-pay | Source: Ambulatory Visit | Attending: Emergency Medicine | Admitting: Emergency Medicine

## 2023-06-21 ENCOUNTER — Other Ambulatory Visit: Payer: Self-pay

## 2023-06-21 VITALS — BP 127/72 | HR 75 | Temp 98.2°F | Resp 18

## 2023-06-21 DIAGNOSIS — J0110 Acute frontal sinusitis, unspecified: Secondary | ICD-10-CM | POA: Diagnosis not present

## 2023-06-21 MED ORDER — AMOXICILLIN-POT CLAVULANATE 875-125 MG PO TABS: 1.0000 | ORAL_TABLET | Freq: Two times a day (BID) | ORAL | 0 refills | Status: DC

## 2023-06-21 MED ORDER — PREDNISONE 10 MG (21) PO TBPK: ORAL_TABLET | Freq: Every day | ORAL | 0 refills | Status: DC

## 2023-06-21 MED ORDER — GUAIFENESIN-CODEINE 100-10 MG/5ML PO SOLN: 5.0000 mL | Freq: Four times a day (QID) | ORAL | 0 refills | Status: DC | PRN

## 2023-06-21 MED ORDER — IPRATROPIUM BROMIDE 0.03 % NA SOLN: 2.0000 | Freq: Two times a day (BID) | NASAL | 0 refills | Status: DC

## 2023-06-21 NOTE — Discharge Instructions (Signed)
 You are being treated for a sinus infection based on research these are typically caused by  a virus and should steadily improve in time it can take up to 7 to 10 days before you truly start to see a turnaround however things will get better, if no improvement seen by Wednesday may pick up Augmentin from the pharmacy  In the meantime take prednisone every morning with food as directed to help reduce sinus pressure  May use nasal spray every morning and every evening for additional comfort  May use cough syrup every 6 hours as needed for comfort, be mindful this will make you feel drowsy    You can take Tylenol and/or Ibuprofen as needed for fever reduction and pain relief.   For cough: honey 1/2 to 1 teaspoon (you can dilute the honey in water or another fluid).  You can also use guaifenesin and dextromethorphan for cough. You can use a humidifier for chest congestion and cough.  If you don't have a humidifier, you can sit in the bathroom with the hot shower running.      For sore throat: try warm salt water gargles, cepacol lozenges, throat spray, warm tea or water with lemon/honey, popsicles or ice, or OTC cold relief medicine for throat discomfort.   For congestion: take a daily anti-histamine like Zyrtec, Claritin, and a oral decongestant, such as pseudoephedrine.  You can also use Flonase 1-2 sprays in each nostril daily.   It is important to stay hydrated: drink plenty of fluids (water, gatorade/powerade/pedialyte, juices, or teas) to keep your throat moisturized and help further relieve irritation/discomfort.

## 2023-06-21 NOTE — ED Triage Notes (Signed)
 Patient presents to [facility] for evaluation of cough x 4 days.  Runny nose, sinus pressure and pain, chills.  Denies fever.  Negative home COVID test.  Does not want testing today.  Concerned for sinus infection.

## 2023-06-21 NOTE — ED Provider Notes (Signed)
 Kimberly Leon    CSN: [id number] Arrival date & time: 06/21/23  0856      History   Chief Complaint Chief Complaint  Patient presents with   Cough    Entered by patient   URI    HPI Kimberly Leon is a 78 y.o. female.   Patient presents for evaluation of nasal congestion, rhinorrhea, nonproductive cough and sinus pressure present for 3 to 4 days.  No known sick contacts.  Tolerating food and liquids.  Has not attempted treatment.  Past Medical History:  Diagnosis Date   Allergic rhinitis    Arthritis    Basal cell carcinoma 05/10/2019   forehead    Cancer (HCC)    skin ca-basal cell   Depression    Endometrial polyp    Family history of adverse reaction to anesthesia    mother-- ponv   GERD (gastroesophageal reflux disease)    Pre-diabetes    Sinus congestion    Wears contact lenses    not currently (03/16/21)    Patient Active Problem List   Diagnosis Date Noted   Hx of total hip arthroplasty, right 08/06/2022   Primary osteoarthritis of right hip 09/01/2021   Osteoarthritis of carpometacarpal (CMC) joint of thumb 10/07/2017   Generalized osteoarthritis of hand 07/19/2014   Depression, major, in remission (HCC) 07/19/2014   GERD without esophagitis 07/19/2014   Prediabetes 07/19/2014   HIP PAIN, RIGHT 01/17/2010   SCIATICA, RIGHT 01/17/2010    Past Surgical History:  Procedure Laterality Date   APPENDECTOMY  1967   BLEPHAROPLASTY Bilateral 04/2016   upper eyelid   CHEILECTOMY Left 03/30/2021   Procedure: CHEILECTOMY;  Surgeon: Rosetta Posner, DPM;  Location: [facility];  Service: Podiatry;  Laterality: Left;   COLONOSCOPY  2014   COMBINED HYSTEROSCOPY DIAGNOSTIC / D&C  08-24-2004   dr Eda Paschal [facility]   DILATATION & CURETTAGE/HYSTEROSCOPY WITH MYOSURE N/A 06/03/2017   Procedure: DILATATION & CURETTAGE/HYSTEROSCOPY WITH MYOSURE RESECTION OF ENDOMETRIAL POLYP;  Surgeon: Dara Lords, MD;  Location: [facility];   Service: Gynecology;  Laterality: N/A;  request to follow 7:30am case inf [facility] block time requests one hour   EXCISION MORTON'S NEUROMA Right 2016   FOOT SURGERY Bilateral 03/1998   removal bone spurs   KNEE ARTHROSCOPY Right 05/05/2020   Procedure: ARTHROSCOPY KNEE;  Surgeon: Kennedy Bucker, MD;  Location: [facility];  Service: Orthopedics;  Laterality: Right;   MOHS SURGERY     ROTATOR CUFF REPAIR Right 2010   TOTAL HIP ARTHROPLASTY Right 08/06/2022   Procedure: TOTAL HIP ARTHROPLASTY;  Surgeon: Donato Heinz, MD;  Location: [facility];  Service: Orthopedics;  Laterality: Right;   TUBAL LIGATION  yrs ago    OB History     Gravida  2   Para  2   Term  2   Preterm      AB      Living  2      SAB      IAB      Ectopic      Multiple      Live Births  2            Home Medications    Prior to Admission medications   Medication Sig Start Date End Date Taking? Authorizing Provider  amoxicillin-clavulanate (AUGMENTIN) 875-125 MG tablet Take 1 tablet by mouth every 12 (twelve) hours. 06/26/23  Yes Amandine Covino R, NP  guaiFENesin-codeine 100-10 MG/5ML syrup Take 5 mLs  by mouth every 6 (six) hours as needed for cough. 06/21/23  Yes Caitlyne Ingham R, NP  ipratropium (ATROVENT) 0.03 % nasal spray Place 2 sprays into both nostrils every 12 (twelve) hours. 06/21/23  Yes Sallee Hogrefe R, NP  predniSONE (STERAPRED UNI-PAK 21 TAB) 10 MG (21) TBPK tablet Take by mouth daily. Take 6 tabs by mouth daily  for 1 days, then 5 tabs for 1 days, then 4 tabs for 1 days, then 3 tabs for 1 days, 2 tabs for 1 days, then 1 tab by mouth daily for 1 days 06/21/23  Yes Kimbella Heisler R, NP  cetirizine (ZYRTEC) 10 MG tablet Take 10 mg by mouth every morning.    [provider]  citalopram (CELEXA) 40 MG tablet Take 40 mg by mouth every morning.    [provider]  clindamycin (CLEOCIN T) 1 % lotion Apply 1 Application topically as needed.    [provider]   Cyanocobalamin (B-12 PO) Take 2,000 mcg by mouth daily.    [provider]  Echinacea 400 MG CAPS Take 1 capsule by mouth daily.    [provider]  estradiol-levonorgestrel (CLIMARA PRO) 0.045-0.015 MG/DAY Place 1 patch onto the skin once a week. 02/26/23   Wyline Beady A, NP  fluorometholone (FML) 0.1 % ophthalmic suspension 1 drop every 4 (four) hours.    [provider]  hydrocortisone 2.5 % lotion Apply 1 Application topically as directed. APPLY TO AFFECTED AREAS EVERY OTHER DAY AS NEEDED 04/03/23   Deirdre Evener, MD  meloxicam (MOBIC) 15 MG tablet Take by mouth. 01/02/23   [provider]  mometasone (ELOCON) 0.1 % lotion Apply topically as directed. Apply to aa's ears QD PRN flares. Avoid face, groin, and underarms. 04/03/23   Deirdre Evener, MD  neomycin-polymyxin b-dexamethasone (MAXITROL) 3.5-10000-0.1 SUSP Place 1 drop into the right eye as needed.    [provider]  omeprazole (PRILOSEC) 20 MG capsule Take 20 mg by mouth every morning. 06/29/21   [provider]  OVER THE COUNTER MEDICATION Take 1 tablet by mouth daily at 6 (six) AM. Theracumin    [provider]  traZODone (DESYREL) 150 MG tablet Take 150 mg by mouth at bedtime.    [provider]  VITAMIN D PO Take 1,000 Units by mouth daily.    [provider]    Family History Family History  Problem Relation Age of Onset   Hypertension Mother    Diabetes Mother    Heart disease Mother    Heart disease Father    Breast cancer Maternal Aunt        Age 25's    Social History Social History   Tobacco Use   Smoking status: Never   Smokeless tobacco: Never  Vaping Use   Vaping status: Never Used  Substance Use Topics   Alcohol use: Yes    Alcohol/week: 7.0 standard drinks of alcohol    Types: 7 Glasses of wine per week    Comment: 5 times weekly wine   Drug use: No     Allergies   Patient has no known allergies.   Review of  Systems Review of Systems   Physical Exam Triage Vital Signs ED Triage Vitals  Encounter Vitals Group     BP 06/21/23 0906 127/72     Systolic BP Percentile --      Diastolic BP Percentile --      Pulse Rate 06/21/23 0906 75  Resp 06/21/23 0906 18     Temp 06/21/23 0906 98.2 F (36.8 C)     Temp Source 06/21/23 0906 Oral     SpO2 06/21/23 0906 97 %     Weight --      Height --      Head Circumference --      Peak Flow --      Pain Score 06/21/23 0907 5     Pain Loc --      Pain Education --      Exclude from Growth Chart --    No data found.  Updated Vital Signs BP 127/72 (BP Location: Left Arm)   Pulse 75   Temp 98.2 F (36.8 C) (Oral)   Resp 18   SpO2 97%   Visual Acuity Right Eye Distance:   Left Eye Distance:   Bilateral Distance:    Right Eye Near:   Left Eye Near:    Bilateral Near:     Physical Exam Constitutional:      Appearance: Normal appearance.  HENT:     Head: Normocephalic.     Right Ear: Tympanic membrane, ear canal and external ear normal.     Left Ear: Tympanic membrane, ear canal and external ear normal.     Nose: Congestion present.     Right Sinus: Frontal sinus tenderness present.     Left Sinus: Frontal sinus tenderness present.     Mouth/Throat:     Pharynx: No oropharyngeal exudate or posterior oropharyngeal erythema.  Eyes:     Extraocular Movements: Extraocular movements intact.  Cardiovascular:     Rate and Rhythm: Normal rate and regular rhythm.     Pulses: Normal pulses.     Heart sounds: Normal heart sounds.  Pulmonary:     Effort: Pulmonary effort is normal.     Breath sounds: Normal breath sounds.  Musculoskeletal:     Cervical back: Normal range of motion and neck supple.  Neurological:     Mental Status: She is alert and oriented to person, place, and time. Mental status is at baseline.      UC Treatments / Results  Labs (all labs ordered are listed, but only abnormal results are displayed) Labs  Reviewed - No data to display  EKG   Radiology No results found.  Procedures Procedures (including critical care time)  Medications Ordered in UC Medications - No data to display  Initial Impression / Assessment and Plan / UC Course  I have reviewed the triage vital signs and the nursing notes.  Pertinent labs & imaging results that were available during my care of the patient were reviewed by me and considered in my medical decision making (see chart for details).  Acute nonrecurrent frontal sinusitis  Patient is in no signs of distress nor toxic appearing.  Vital signs are stable.  Low suspicion for pneumonia, pneumothorax or bronchitis and therefore will defer imaging.  Presentation consistent with a sinusitis, present for 4 days most likely viral, discussed this with patient.  Home COVID testing negative.  Prescribed prednisone, Atrovent nasal spray and guaifenesin codeine, PDMP reviewed, low risk, watch wait antibiotic placed at pharmacy.May use additional over-the-counter medications as needed for supportive care.  May follow-up with urgent care as needed if symptoms persist or worsen.   Final Clinical Impressions(s) / UC Diagnoses   Final diagnoses:  Acute non-recurrent frontal sinusitis     Discharge Instructions      You are being treated for a sinus infection  based on research these are typically caused by  a virus and should steadily improve in time it can take up to 7 to 10 days before you truly start to see a turnaround however things will get better, if no improvement seen by Wednesday may pick up Augmentin from the pharmacy  In the meantime take prednisone every morning with food as directed to help reduce sinus pressure  May use nasal spray every morning and every evening for additional comfort  May use cough syrup every 6 hours as needed for comfort, be mindful this will make you feel drowsy    You can take Tylenol and/or Ibuprofen as needed for fever  reduction and pain relief.   For cough: honey 1/2 to 1 teaspoon (you can dilute the honey in water or another fluid).  You can also use guaifenesin and dextromethorphan for cough. You can use a humidifier for chest congestion and cough.  If you don't have a humidifier, you can sit in the bathroom with the hot shower running.      For sore throat: try warm salt water gargles, cepacol lozenges, throat spray, warm tea or water with lemon/honey, popsicles or ice, or OTC cold relief medicine for throat discomfort.   For congestion: take a daily anti-histamine like Zyrtec, Claritin, and a oral decongestant, such as pseudoephedrine.  You can also use Flonase 1-2 sprays in each nostril daily.   It is important to stay hydrated: drink plenty of fluids (water, gatorade/powerade/pedialyte, juices, or teas) to keep your throat moisturized and help further relieve irritation/discomfort.    ED Prescriptions     Medication Sig Dispense Auth. Provider   predniSONE (STERAPRED UNI-PAK 21 TAB) 10 MG (21) TBPK tablet Take by mouth daily. Take 6 tabs by mouth daily  for 1 days, then 5 tabs for 1 days, then 4 tabs for 1 days, then 3 tabs for 1 days, 2 tabs for 1 days, then 1 tab by mouth daily for 1 days 21 tablet Aristides Luckey R, NP   ipratropium (ATROVENT) 0.03 % nasal spray Place 2 sprays into both nostrils every 12 (twelve) hours. 30 mL Koltan Portocarrero R, NP   guaiFENesin-codeine 100-10 MG/5ML syrup Take 5 mLs by mouth every 6 (six) hours as needed for cough. 120 mL Velna Hedgecock R, NP   amoxicillin-clavulanate (AUGMENTIN) 875-125 MG tablet Take 1 tablet by mouth every 12 (twelve) hours. 14 tablet Gatsby Chismar, Elita Boone, NP      I have reviewed the PDMP during this encounter.   Valinda Hoar, [place] 06/21/23 [phone]

## 2023-07-02 DIAGNOSIS — Z0000 Encounter for general adult medical examination without abnormal findings: Secondary | ICD-10-CM | POA: Diagnosis not present

## 2023-07-02 DIAGNOSIS — R7303 Prediabetes: Secondary | ICD-10-CM | POA: Diagnosis not present

## 2023-09-02 DIAGNOSIS — H43812 Vitreous degeneration, left eye: Secondary | ICD-10-CM | POA: Diagnosis not present

## 2023-09-19 DIAGNOSIS — A09 Infectious gastroenteritis and colitis, unspecified: Secondary | ICD-10-CM | POA: Diagnosis not present

## 2023-10-29 DIAGNOSIS — S20212A Contusion of left front wall of thorax, initial encounter: Secondary | ICD-10-CM | POA: Diagnosis not present

## 2023-10-29 DIAGNOSIS — M1711 Unilateral primary osteoarthritis, right knee: Secondary | ICD-10-CM | POA: Diagnosis not present

## 2023-10-29 DIAGNOSIS — S8001XA Contusion of right knee, initial encounter: Secondary | ICD-10-CM | POA: Diagnosis not present

## 2023-10-29 DIAGNOSIS — R0781 Pleurodynia: Secondary | ICD-10-CM | POA: Diagnosis not present

## 2023-10-29 DIAGNOSIS — M25561 Pain in right knee: Secondary | ICD-10-CM | POA: Diagnosis not present

## 2023-11-08 DIAGNOSIS — M7061 Trochanteric bursitis, right hip: Secondary | ICD-10-CM | POA: Diagnosis not present

## 2024-01-09 DIAGNOSIS — F325 Major depressive disorder, single episode, in full remission: Secondary | ICD-10-CM | POA: Diagnosis not present

## 2024-01-09 DIAGNOSIS — R799 Abnormal finding of blood chemistry, unspecified: Secondary | ICD-10-CM | POA: Diagnosis not present

## 2024-01-09 DIAGNOSIS — Z0000 Encounter for general adult medical examination without abnormal findings: Secondary | ICD-10-CM | POA: Diagnosis not present

## 2024-01-09 DIAGNOSIS — Z1331 Encounter for screening for depression: Secondary | ICD-10-CM | POA: Diagnosis not present

## 2024-01-09 DIAGNOSIS — Z23 Encounter for immunization: Secondary | ICD-10-CM | POA: Diagnosis not present

## 2024-01-09 DIAGNOSIS — R7303 Prediabetes: Secondary | ICD-10-CM | POA: Diagnosis not present

## 2024-01-13 DIAGNOSIS — R799 Abnormal finding of blood chemistry, unspecified: Secondary | ICD-10-CM | POA: Diagnosis not present

## 2024-01-13 DIAGNOSIS — R7303 Prediabetes: Secondary | ICD-10-CM | POA: Diagnosis not present

## 2024-01-13 DIAGNOSIS — Z0000 Encounter for general adult medical examination without abnormal findings: Secondary | ICD-10-CM | POA: Diagnosis not present

## 2024-01-14 ENCOUNTER — Ambulatory Visit: Admitting: Dermatology

## 2024-01-14 ENCOUNTER — Encounter: Payer: Self-pay | Admitting: Dermatology

## 2024-01-14 ENCOUNTER — Ambulatory Visit: Payer: PPO | Admitting: Dermatology

## 2024-01-14 DIAGNOSIS — L821 Other seborrheic keratosis: Secondary | ICD-10-CM | POA: Diagnosis not present

## 2024-01-14 DIAGNOSIS — L578 Other skin changes due to chronic exposure to nonionizing radiation: Secondary | ICD-10-CM | POA: Diagnosis not present

## 2024-01-14 DIAGNOSIS — D1801 Hemangioma of skin and subcutaneous tissue: Secondary | ICD-10-CM

## 2024-01-14 DIAGNOSIS — W908XXA Exposure to other nonionizing radiation, initial encounter: Secondary | ICD-10-CM | POA: Diagnosis not present

## 2024-01-14 DIAGNOSIS — L814 Other melanin hyperpigmentation: Secondary | ICD-10-CM

## 2024-01-14 DIAGNOSIS — Z1283 Encounter for screening for malignant neoplasm of skin: Secondary | ICD-10-CM | POA: Diagnosis not present

## 2024-01-14 DIAGNOSIS — D229 Melanocytic nevi, unspecified: Secondary | ICD-10-CM

## 2024-01-14 DIAGNOSIS — Z85828 Personal history of other malignant neoplasm of skin: Secondary | ICD-10-CM

## 2024-01-14 DIAGNOSIS — L820 Inflamed seborrheic keratosis: Secondary | ICD-10-CM | POA: Diagnosis not present

## 2024-01-14 NOTE — Patient Instructions (Addendum)

## 2024-01-14 NOTE — Progress Notes (Signed)
   Follow-Up Visit   Subjective  Kimberly Leon is a 78 y.o. female who presents for the following: Skin Cancer Screening and Full Body Skin Exam, hx of BCC   The patient presents for Total-Body Skin Exam (TBSE) for skin cancer screening and mole check. The patient has spots, moles and lesions to be evaluated, some may be new or changing and the patient may have concern these could be cancer.  The following portions of the chart were reviewed this encounter and updated as appropriate: medications, allergies, medical history  Review of Systems:  No other skin or systemic complaints except as noted in HPI or Assessment and Plan.  Objective  Well appearing patient in no apparent distress; mood and affect are within normal limits.  A full examination was performed including scalp, head, eyes, ears, nose, lips, neck, chest, axillae, abdomen, back, buttocks, bilateral upper extremities, bilateral lower extremities, hands, feet, fingers, toes, fingernails, and toenails. All findings within normal limits unless otherwise noted below.   Relevant physical exam findings are noted in the Assessment and Plan.  right forearm x 1 Stuck-on, waxy, tan-brown papules and plaques -- Discussed benign etiology and prognosis.   Assessment & Plan   SKIN CANCER SCREENING PERFORMED TODAY.  ACTINIC DAMAGE - Chronic condition, secondary to cumulative UV/sun exposure - diffuse scaly erythematous macules with underlying dyspigmentation - Recommend daily broad spectrum sunscreen SPF 30+ to sun-exposed areas, reapply every 2 hours as needed.  - Staying in the shade or wearing long sleeves, sun glasses (UVA+UVB protection) and wide brim hats (4-inch brim around the entire circumference of the hat) are also recommended for sun protection.  - Call for new or changing lesions.  LENTIGINES, SEBORRHEIC KERATOSES, HEMANGIOMAS - Benign normal skin lesions - Benign-appearing - Call for any changes  MELANOCYTIC NEVI -  Tan-brown and/or pink-flesh-colored symmetric macules and papules - Benign appearing on exam today - Observation - Call clinic for new or changing moles - Recommend daily use of broad spectrum spf 30+ sunscreen to sun-exposed areas.   HISTORY OF BASAL CELL CARCINOMA OF THE SKIN - No evidence of recurrence today - Recommend regular full body skin exams - Recommend daily broad spectrum sunscreen SPF 30+ to sun-exposed areas, reapply every 2 hours as needed.  - Call if any new or changing lesions are noted between office visits   INFLAMED SEBORRHEIC KERATOSIS right forearm x 1 Symptomatic, irritating, patient would like treated.  Destruction of lesion - right forearm x 1 Complexity: simple   Destruction method: cryotherapy   Informed consent: discussed and consent obtained   Timeout:  patient name, date of birth, surgical site, and procedure verified Lesion destroyed using liquid nitrogen: Yes   Region frozen until ice ball extended beyond lesion: Yes   Outcome: patient tolerated procedure well with no complications   Post-procedure details: wound care instructions given     Return in about 1 year (around 01/13/2025) for TBSE, hx of BCC .  IFay Kirks, CMA, am acting as scribe for Alm Rhyme, MD .   Documentation: I have reviewed the above documentation for accuracy and completeness, and I agree with the above.  Alm Rhyme, MD

## 2024-02-13 ENCOUNTER — Emergency Department

## 2024-02-13 ENCOUNTER — Other Ambulatory Visit: Payer: Self-pay

## 2024-02-13 ENCOUNTER — Emergency Department
Admission: EM | Admit: 2024-02-13 | Discharge: 2024-02-13 | Disposition: A | Discharge status: Home / Self Care | Attending: Emergency Medicine | Admitting: Emergency Medicine

## 2024-02-13 DIAGNOSIS — R001 Bradycardia, unspecified: Secondary | ICD-10-CM | POA: Diagnosis not present

## 2024-02-13 DIAGNOSIS — R079 Chest pain, unspecified: Secondary | ICD-10-CM | POA: Insufficient documentation

## 2024-02-13 DIAGNOSIS — Z96641 Presence of right artificial hip joint: Secondary | ICD-10-CM | POA: Diagnosis not present

## 2024-02-13 DIAGNOSIS — R0789 Other chest pain: Secondary | ICD-10-CM | POA: Diagnosis not present

## 2024-02-13 DIAGNOSIS — Z85828 Personal history of other malignant neoplasm of skin: Secondary | ICD-10-CM | POA: Diagnosis not present

## 2024-02-13 DIAGNOSIS — R7989 Other specified abnormal findings of blood chemistry: Secondary | ICD-10-CM | POA: Diagnosis not present

## 2024-02-13 DIAGNOSIS — Z0189 Encounter for other specified special examinations: Secondary | ICD-10-CM | POA: Diagnosis not present

## 2024-02-13 LAB — BASIC METABOLIC PANEL WITH GFR
Anion gap: 10 (ref 5–15)
BUN: 11 mg/dL (ref 8–23)
CO2: 23 mmol/L (ref 22–32)
Calcium: 8.9 mg/dL (ref 8.9–10.3)
Chloride: 104 mmol/L (ref 98–111)
Creatinine, Ser: 0.6 mg/dL (ref 0.44–1.00)
GFR, Estimated: >60 mL/min (ref >60)
Glucose, Bld: 120 mg/dL — ABNORMAL HIGH (ref 70–99)
Potassium: 3.9 mmol/L (ref 3.5–5.1)
Sodium: 137 mmol/L (ref 135–145)

## 2024-02-13 LAB — D-DIMER, QUANTITATIVE: D-Dimer, Quant: 1.05 ug{FEU}/mL — ABNORMAL HIGH (ref 0.00–0.50)

## 2024-02-13 LAB — CBC
HCT: 36.5 % (ref 36.0–46.0)
Hemoglobin: 12.3 g/dL (ref 12.0–15.0)
MCH: 31 pg (ref 26.0–34.0)
MCHC: 33.7 g/dL (ref 30.0–36.0)
MCV: 91.9 fL (ref 80.0–100.0)
Platelets: 252 K/uL (ref 150–400)
RBC: 3.97 MIL/uL (ref 3.87–5.11)
RDW: 12.8 % (ref 11.5–15.5)
WBC: 5.9 K/uL (ref 4.0–10.5)
nRBC: 0 % (ref 0.0–0.2)

## 2024-02-13 LAB — TROPONIN T, HIGH SENSITIVITY
Troponin T High Sensitivity: <15 ng/L (ref 0–19)
Troponin T High Sensitivity: <15 ng/L (ref 0–19)

## 2024-02-13 MED ORDER — NITROGLYCERIN 0.4 MG SL SUBL
0.4000 mg | SUBLINGUAL_TABLET | SUBLINGUAL | Status: DC | PRN
  Filled 2024-02-13: qty 1

## 2024-02-13 MED ORDER — IOHEXOL 350 MG/ML SOLN
75.0000 mL | Freq: Once | INTRAVENOUS | Status: AC | PRN
  Administered 2024-02-13: 75 mL via INTRAVENOUS

## 2024-02-13 NOTE — ED Provider Notes (Signed)
 [facility] Provider Note    Event Date/Time   First MD Initiated Contact with Patient 02/13/24 0234     (approximate)   History   Chest Pain   HPI  Kimberly Leon is a 78 y.o. female with history of seasonal affective disorder who presents to the emergency department with complaints of chest tightness in the left side of her chest without radiation that started tonight.  Took full dose aspirin at home and given 1 nitroglycerin under her tongue with EMS and pain improved but not resolved.  No associated shortness of breath, nausea or vomiting, diaphoresis or dizziness, fever or cough.  No prior history of PE or DVT.  No calf tenderness or calf swelling.  No recent prolonged immobilization, surgery, hospitalization, long travel, or fracture but is on Climara  Pro which is a transdermal estradiol  and levonorgestrel .     History provided by patient, husband, EMS.    Past Medical History:  Diagnosis Date   Allergic rhinitis    Arthritis    Basal cell carcinoma 05/10/2019   forehead    Cancer (HCC)    skin ca-basal cell   Depression    Endometrial polyp    Family history of adverse reaction to anesthesia    mother-- ponv   GERD (gastroesophageal reflux disease)    Pre-diabetes    Sinus congestion    Wears contact lenses    not currently (03/16/21)    Past Surgical History:  Procedure Laterality Date   APPENDECTOMY  1967   BLEPHAROPLASTY Bilateral 04/2016   upper eyelid   CHEILECTOMY Left 03/30/2021   Procedure: CHEILECTOMY;  Surgeon: Lennie Barter, DPM;  Location: [facility];  Service: Podiatry;  Laterality: Left;   COLONOSCOPY  2014   COMBINED HYSTEROSCOPY DIAGNOSTIC / D&C  08-24-2004   dr norval [facility]   DILATATION & CURETTAGE/HYSTEROSCOPY WITH MYOSURE N/A 06/03/2017   Procedure: DILATATION & CURETTAGE/HYSTEROSCOPY WITH MYOSURE RESECTION OF ENDOMETRIAL POLYP;  Surgeon: Rockney Evalene SQUIBB, MD;  Location: [facility];   Service: Gynecology;  Laterality: N/A;  request to follow 7:30am case inf [facility] block time requests one hour   EXCISION MORTON'S NEUROMA Right 2016   FOOT SURGERY Bilateral 03/1998   removal bone spurs   KNEE ARTHROSCOPY Right 05/05/2020   Procedure: ARTHROSCOPY KNEE;  Surgeon: Kathlynn Sharper, MD;  Location: [facility];  Service: Orthopedics;  Laterality: Right;   MOHS SURGERY     ROTATOR CUFF REPAIR Right 2010   TOTAL HIP ARTHROPLASTY Right 08/06/2022   Procedure: TOTAL HIP ARTHROPLASTY;  Surgeon: Mardee Lynwood SQUIBB, MD;  Location: [facility];  Service: Orthopedics;  Laterality: Right;   TUBAL LIGATION  yrs ago    MEDICATIONS:  Prior to Admission medications   Medication Sig Start Date End Date Taking? Authorizing Provider  amoxicillin -clavulanate (AUGMENTIN ) 875-125 MG tablet Take 1 tablet by mouth every 12 (twelve) hours. 06/26/23   Teresa Shelba SAUNDERS, NP  cetirizine (ZYRTEC) 10 MG tablet Take 10 mg by mouth every morning.    [provider]  citalopram  (CELEXA ) 40 MG tablet Take 40 mg by mouth every morning.    [provider]  clindamycin (CLEOCIN T) 1 % lotion Apply 1 Application topically as needed.    [provider]  Cyanocobalamin (B-12 PO) Take 2,000 mcg by mouth daily.    [provider]  Echinacea 400 MG CAPS Take 1 capsule by mouth daily.    [provider]  estradiol -levonorgestrel  (CLIMARA  PRO) 0.045-0.015  MG/DAY Place 1 patch onto the skin once a week. 02/26/23   Prentiss Riggs A, NP  fluorometholone  (FML) 0.1 % ophthalmic suspension 1 drop every 4 (four) hours.    [provider]  guaiFENesin -codeine  100-10 MG/5ML syrup Take 5 mLs by mouth every 6 (six) hours as needed for cough. 06/21/23   Teresa Shelba SAUNDERS, NP  hydrocortisone  2.5 % lotion Apply 1 Application topically as directed. APPLY TO AFFECTED AREAS EVERY OTHER DAY AS NEEDED 04/03/23   Hester Alm BROCKS, MD  ipratropium (ATROVENT ) 0.03 % nasal spray Place 2 sprays  into both nostrils every 12 (twelve) hours. 06/21/23   Teresa Shelba SAUNDERS, NP  meloxicam (MOBIC) 15 MG tablet Take by mouth. 01/02/23   [provider]  mometasone  (ELOCON ) 0.1 % lotion Apply topically as directed. Apply to aa's ears QD PRN flares. Avoid face, groin, and underarms. 04/03/23   Hester Alm BROCKS, MD  neomycin -polymyxin b-dexamethasone  (MAXITROL) 3.5-10000-0.1 SUSP Place 1 drop into the right eye as needed.    [provider]  omeprazole (PRILOSEC) 20 MG capsule Take 20 mg by mouth every morning. 06/29/21   [provider]  OVER THE COUNTER MEDICATION Take 1 tablet by mouth daily at 6 (six) AM. Theracumin    [provider]  predniSONE  (STERAPRED UNI-PAK 21 TAB) 10 MG (21) TBPK tablet Take by mouth daily. Take 6 tabs by mouth daily  for 1 days, then 5 tabs for 1 days, then 4 tabs for 1 days, then 3 tabs for 1 days, 2 tabs for 1 days, then 1 tab by mouth daily for 1 days 06/21/23   Teresa Shelba SAUNDERS, NP  traZODone  (DESYREL ) 150 MG tablet Take 150 mg by mouth at bedtime.    [provider]  VITAMIN D PO Take 1,000 Units by mouth daily.    [provider]    Physical Exam   Triage Vital Signs: ED Triage Vitals  Encounter Vitals Group     BP 02/13/24 0229 132/67     Girls Systolic BP Percentile --      Girls Diastolic BP Percentile --      Boys Systolic BP Percentile --      Boys Diastolic BP Percentile --      Pulse Rate 02/13/24 0227 75     Resp 02/13/24 0227 18     Temp 02/13/24 0227 97.9 F (36.6 C)     Temp Source 02/13/24 0227 Oral     SpO2 02/13/24 0227 92 %     Weight 02/13/24 0228 125 lb (56.7 kg)     Height 02/13/24 0228 5' 2 (1.575 m)     Head Circumference --      Peak Flow --      Pain Score 02/13/24 0228 5     Pain Loc --      Pain Education --      Exclude from Growth Chart --     Most recent vital signs: Vitals:   02/13/24 0300 02/13/24 0600  BP: 136/61 (!) 120/58  Pulse: 66 76  Resp: 18 16  Temp:     SpO2: 94% 96%    CONSTITUTIONAL: Alert, responds appropriately to questions. Well-appearing; well-nourished HEAD: Normocephalic, atraumatic EYES: Conjunctivae clear, pupils appear equal, sclera nonicteric ENT: normal nose; moist mucous membranes NECK: Supple, normal ROM CARD: RRR; S1 and S2 appreciated RESP: Normal chest excursion without splinting or tachypnea; breath sounds clear and equal bilaterally; no wheezes, no rhonchi, no rales, no hypoxia or respiratory  distress, speaking full sentences ABD/GI: Non-distended; soft, non-tender, no rebound, no guarding, no peritoneal signs BACK: The back appears normal EXT: Normal ROM in all joints; no deformity noted, no edema, no calf tenderness or calf swelling SKIN: Normal color for age and race; warm; no rash on exposed skin NEURO: Moves all extremities equally, normal speech PSYCH: The patient's mood and manner are appropriate.   ED Results / Procedures / Treatments   LABS: (all labs ordered are listed, but only abnormal results are displayed) Labs Reviewed  BASIC METABOLIC PANEL WITH GFR - Abnormal; Notable for the following components:      Result Value   Glucose, Bld 120 (*)    All other components within normal limits  D-DIMER, QUANTITATIVE - Abnormal; Notable for the following components:   D-Dimer, Quant 1.05 (*)    All other components within normal limits  CBC  TROPONIN T, HIGH SENSITIVITY  TROPONIN T, HIGH SENSITIVITY     EKG:  EKG Interpretation Date/Time:  Thursday February 13 2024 02:35:22 EST Ventricular Rate:  70 PR Interval:  175 QRS Duration:  140 QT Interval:  487 QTC Calculation: 526 R Axis:   -39  Text Interpretation: Sinus rhythm Left bundle branch block No significant change since last tracing Confirmed by Neomi Neptune [phone]) on 02/13/2024 2:38:12 AM         RADIOLOGY: My personal review and interpretation of imaging: Chest x-ray clear.  CTA chest shows no PE.  I have personally reviewed  all radiology reports.   CT Angio Chest PE W and/or Wo Contrast Result Date: 02/13/2024 EXAM: CTA of the Chest with contrast for PE 02/13/2024 04:25:16 AM TECHNIQUE: CTA of the chest was performed after the administration of 75 mL of iohexol (OMNIPAQUE) 350 MG/ML injection. Multiplanar reformatted images are provided for review. MIP images are provided for review. Automated exposure control, iterative reconstruction, and/or weight based adjustment of the mA/kV was utilized to reduce the radiation dose to as low as reasonably achievable. COMPARISON: None available. CLINICAL HISTORY: Pulmonary embolism (PE) suspected, low to intermediate prob, positive D-dimer. FINDINGS: PULMONARY ARTERIES: Pulmonary arteries are adequately opacified for evaluation. No pulmonary embolism. Main pulmonary artery is normal in caliber. MEDIASTINUM: Mild cardiac enlargement. Coronary artery calcifications. 1.8 cm low-density nodule in the left lobe of the thyroid  gland, image 19/4. Small hiatal hernia. There is no acute abnormality of the thoracic aorta. LYMPH NODES: Calcified mediastinal and left hilar lymph nodes compatible with prior granulomatous disease. No axillary lymphadenopathy. LUNGS AND PLEURA: Mild dependent changes within the posterior lung bases. Calcified granuloma noted in the left lower lobe. No focal consolidation or pulmonary edema. No pleural effusion or pneumothorax. UPPER ABDOMEN: Calcified granuloma is noted within the liver and spleen. Limited images of the upper abdomen are otherwise unremarkable. SOFT TISSUES AND BONES: No acute bone or soft tissue abnormality. IMPRESSION: 1. No pulmonary embolism. 2. Mild dependent changes within the posterior lung bases. Electronically signed by: Waddell Calk MD 02/13/2024 05:53 AM EST RP Workstation: [id number]   DG Chest Portable 1 View Result Date: 02/13/2024 EXAM: 1 VIEW(S) XRAY OF THE CHEST 02/13/2024 03:10:00 AM COMPARISON: None available. CLINICAL HISTORY:  FINDINGS: LUNGS AND PLEURA: No focal pulmonary opacity. No pleural effusion. No pneumothorax. HEART AND MEDIASTINUM: No acute abnormality of the cardiac and mediastinal silhouettes. BONES AND SOFT TISSUES: No acute osseous abnormality. IMPRESSION: 1. No acute process. Electronically signed by: Dorethia Molt MD 02/13/2024 03:25 AM EST RP Workstation: [id number]     PROCEDURES:  Critical Care performed:  No     Procedures    IMPRESSION / MDM / ASSESSMENT AND PLAN / ED COURSE  I reviewed the triage vital signs and the nursing notes.    Patient with chest pain.  The patient is on the cardiac monitor to evaluate for evidence of arrhythmia and/or significant heart rate changes.   DIFFERENTIAL DIAGNOSIS (includes but not limited to):   ACS, PE, pneumonia, viral URI, musculoskeletal chest pain, GERD, esophagitis, doubt dissection, CHF, pneumothorax   Patient's presentation is most consistent with acute presentation with potential threat to life or bodily function.   PLAN: Will obtain cardiac labs.  Given she is on estrogen patches, will obtain D-dimer to rule out PE.  EKG shows left bundle branch block with no new ischemic change compared to previous.  Chest x-ray pending.  Will give nitroglycerin here to get her chest pain-free.   MEDICATIONS GIVEN IN ED: Medications  nitroGLYCERIN (NITROSTAT) SL tablet 0.4 mg (has no administration in time range)  iohexol (OMNIPAQUE) 350 MG/ML injection 75 mL (75 mLs Intravenous Contrast Given 02/13/24 0420)     ED COURSE: First troponin negative.  Second pending.  D-dimer elevated.  Will obtain CTA of the chest.  Normal hemoglobin, electrolytes.  Chest x-ray reviewed and interpreted by myself and the radiologist and is unremarkable.    Second troponin negative.  CTA of the chest reviewed and interpreted by myself and the radiologist and shows no PE or other acute abnormality.  Patient now pain-free.  No event seen on cardiac monitoring.   Hemodynamically stable.  No significant risk factors for ACS other than age and reports family history of a father who died from a heart attack.  Will give cardiology referral information but I feel that this can be appropriately managed as an outpatient.  She is comfortable with this plan.   At this time, I do not feel there is any life-threatening condition present. I reviewed all nursing notes, vitals, pertinent previous records.  All lab and urine results, EKGs, imaging ordered have been independently reviewed and interpreted by myself.  I reviewed all available radiology reports from any imaging ordered this visit.  Based on my assessment, I feel the patient is safe to be discharged home without further emergent workup and can continue workup as an outpatient as needed. Discussed all findings, treatment plan as well as usual and customary return precautions.  They verbalize understanding and are comfortable with this plan.  Outpatient follow-up has been provided as needed.  All questions have been answered.  CONSULTS:  NONE   OUTSIDE RECORDS REVIEWED: Reviewed recent PCP notes.       FINAL CLINICAL IMPRESSION(S) / ED DIAGNOSES   Final diagnoses:  Nonspecific chest pain     Rx / DC Orders   ED Discharge Orders          Ordered    Ambulatory referral to Cardiology       Comments: If you have not heard from the Cardiology office within the next 72 hours please call [phone].   02/13/24 0556             Note:  This document was prepared using Dragon voice recognition software and may include unintentional dictation errors.   Erandi Lemma, Josette SAILOR, DO 02/13/24 [phone]

## 2024-02-13 NOTE — ED Triage Notes (Signed)
 Pt to ED by [facility] from home for chest pain. Pt reports she was sitting in the chair at home when she felt a sudden onset of 5/10 chest pain she describes as pressure. Pt denies shortness of breath. Denies cardiac history.  138/67, HR 60, 95% room air, blood sugar 125.  Pt took 325mg  aspirin at home prior to EMS arrival, EMS gave 1 spray of nitroglycerin  which pt reports relieved her pain. Pt A&Ox4.

## 2024-02-20 ENCOUNTER — Emergency Department

## 2024-02-20 ENCOUNTER — Encounter
Admission: EM | Disposition: A | Discharge status: Home / Self Care | Payer: Self-pay | Source: Home / Self Care | Attending: Emergency Medicine

## 2024-02-20 ENCOUNTER — Other Ambulatory Visit: Payer: Self-pay

## 2024-02-20 ENCOUNTER — Observation Stay: Admitting: Anesthesiology

## 2024-02-20 ENCOUNTER — Observation Stay
Admission: EM | Admit: 2024-02-20 | Discharge: 2024-02-21 | Disposition: A | Discharge status: Home / Self Care | Attending: Emergency Medicine | Admitting: Emergency Medicine

## 2024-02-20 DIAGNOSIS — Z85828 Personal history of other malignant neoplasm of skin: Secondary | ICD-10-CM | POA: Insufficient documentation

## 2024-02-20 DIAGNOSIS — K8000 Calculus of gallbladder with acute cholecystitis without obstruction: Principal | ICD-10-CM | POA: Insufficient documentation

## 2024-02-20 DIAGNOSIS — K8020 Calculus of gallbladder without cholecystitis without obstruction: Secondary | ICD-10-CM | POA: Diagnosis not present

## 2024-02-20 DIAGNOSIS — R42 Dizziness and giddiness: Secondary | ICD-10-CM | POA: Diagnosis not present

## 2024-02-20 DIAGNOSIS — R519 Headache, unspecified: Secondary | ICD-10-CM | POA: Diagnosis not present

## 2024-02-20 DIAGNOSIS — R1084 Generalized abdominal pain: Secondary | ICD-10-CM | POA: Diagnosis not present

## 2024-02-20 DIAGNOSIS — K810 Acute cholecystitis: Principal | ICD-10-CM | POA: Diagnosis present

## 2024-02-20 DIAGNOSIS — Z96641 Presence of right artificial hip joint: Secondary | ICD-10-CM | POA: Diagnosis not present

## 2024-02-20 DIAGNOSIS — M549 Dorsalgia, unspecified: Secondary | ICD-10-CM | POA: Diagnosis not present

## 2024-02-20 DIAGNOSIS — K219 Gastro-esophageal reflux disease without esophagitis: Secondary | ICD-10-CM | POA: Diagnosis not present

## 2024-02-20 DIAGNOSIS — R1013 Epigastric pain: Secondary | ICD-10-CM | POA: Diagnosis not present

## 2024-02-20 LAB — COMPREHENSIVE METABOLIC PANEL WITH GFR
ALT: 12 U/L (ref 0–44)
AST: 21 U/L (ref 15–41)
Albumin: 3.8 g/dL (ref 3.5–5.0)
Alkaline Phosphatase: 54 U/L (ref 38–126)
Anion gap: 10 (ref 5–15)
BUN: 8 mg/dL (ref 8–23)
CO2: 21 mmol/L — ABNORMAL LOW (ref 22–32)
Calcium: 8.5 mg/dL — ABNORMAL LOW (ref 8.9–10.3)
Chloride: 105 mmol/L (ref 98–111)
Creatinine, Ser: 0.67 mg/dL (ref 0.44–1.00)
GFR, Estimated: >60 mL/min (ref >60)
Glucose, Bld: 165 mg/dL — ABNORMAL HIGH (ref 70–99)
Potassium: 3.6 mmol/L (ref 3.5–5.1)
Sodium: 136 mmol/L (ref 135–145)
Total Bilirubin: 0.8 mg/dL (ref 0.0–1.2)
Total Protein: 6.2 g/dL — ABNORMAL LOW (ref 6.5–8.1)

## 2024-02-20 LAB — CBC WITH DIFFERENTIAL/PLATELET
Abs Immature Granulocytes: 0.1 K/uL — ABNORMAL HIGH (ref 0.00–0.07)
Basophils Absolute: 0.1 K/uL (ref 0.0–0.1)
Basophils Relative: 1 %
Eosinophils Absolute: 0 K/uL (ref 0.0–0.5)
Eosinophils Relative: 0 %
HCT: 37.1 % (ref 36.0–46.0)
Hemoglobin: 12.8 g/dL (ref 12.0–15.0)
Immature Granulocytes: 1 %
Lymphocytes Relative: 7 %
Lymphs Abs: 0.8 K/uL (ref 0.7–4.0)
MCH: 31.4 pg (ref 26.0–34.0)
MCHC: 34.5 g/dL (ref 30.0–36.0)
MCV: 90.9 fL (ref 80.0–100.0)
Monocytes Absolute: 1.2 K/uL — ABNORMAL HIGH (ref 0.1–1.0)
Monocytes Relative: 9 %
Neutro Abs: 10 K/uL — ABNORMAL HIGH (ref 1.7–7.7)
Neutrophils Relative %: 82 %
Platelets: 240 K/uL (ref 150–400)
RBC: 4.08 MIL/uL (ref 3.87–5.11)
RDW: 12.3 % (ref 11.5–15.5)
WBC: 12.2 K/uL — ABNORMAL HIGH (ref 4.0–10.5)
nRBC: 0 % (ref 0.0–0.2)

## 2024-02-20 LAB — MAGNESIUM: Magnesium: 2 mg/dL (ref 1.7–2.4)

## 2024-02-20 LAB — TROPONIN T, HIGH SENSITIVITY: Troponin T High Sensitivity: <15 ng/L (ref 0–19)

## 2024-02-20 LAB — LIPASE, BLOOD: Lipase: 20 U/L (ref 11–51)

## 2024-02-20 SURGERY — CHOLECYSTECTOMY, ROBOT-ASSISTED, LAPAROSCOPIC
Anesthesia: General

## 2024-02-20 MED ORDER — TRAZODONE HCL 50 MG PO TABS
150.0000 mg | ORAL_TABLET | Freq: Every day | ORAL | Status: DC
  Administered 2024-02-20: 150 mg via ORAL
  Filled 2024-02-20: qty 1

## 2024-02-20 MED ORDER — ACETAMINOPHEN 10 MG/ML IV SOLN
INTRAVENOUS | Status: DC | PRN
Start: 2024-02-20 — End: 2024-02-20
  Administered 2024-02-20: 1000 mg via INTRAVENOUS

## 2024-02-20 MED ORDER — MORPHINE SULFATE (PF) 4 MG/ML IV SOLN
4.0000 mg | Freq: Once | INTRAVENOUS | Status: AC
  Administered 2024-02-20: 4 mg via INTRAVENOUS
  Filled 2024-02-20: qty 1

## 2024-02-20 MED ORDER — SUGAMMADEX SODIUM 200 MG/2ML IV SOLN
INTRAVENOUS | Status: DC | PRN
  Administered 2024-02-20: 200 mg via INTRAVENOUS

## 2024-02-20 MED ORDER — DEXAMETHASONE SOD PHOSPHATE PF 10 MG/ML IJ SOLN
INTRAMUSCULAR | Status: DC | PRN
  Administered 2024-02-20: 6 mg via INTRAVENOUS

## 2024-02-20 MED ORDER — ONDANSETRON 4 MG PO TBDP: 4.0000 mg | ORAL_TABLET | Freq: Four times a day (QID) | ORAL | Status: DC | PRN

## 2024-02-20 MED ORDER — ACETAMINOPHEN 650 MG RE SUPP
650.0000 mg | Freq: Four times a day (QID) | RECTAL | Status: DC | PRN
Start: 2024-02-20 — End: 2024-02-21

## 2024-02-20 MED ORDER — BUPIVACAINE-EPINEPHRINE 0.25% -1:200000 IJ SOLN
INTRAMUSCULAR | Status: DC | PRN
  Administered 2024-02-20: 20 mL

## 2024-02-20 MED ORDER — PROPOFOL 10 MG/ML IV BOLUS
INTRAVENOUS | Status: AC
  Filled 2024-02-20: qty 20

## 2024-02-20 MED ORDER — ROCURONIUM BROMIDE 100 MG/10ML IV SOLN
INTRAVENOUS | Status: DC | PRN
  Administered 2024-02-20: 10 mg via INTRAVENOUS
  Administered 2024-02-20: 50 mg via INTRAVENOUS

## 2024-02-20 MED ORDER — PROPOFOL 10 MG/ML IV BOLUS
INTRAVENOUS | Status: DC | PRN
Start: 2024-02-20 — End: 2024-02-20
  Administered 2024-02-20: 110 mg via INTRAVENOUS

## 2024-02-20 MED ORDER — ACETAMINOPHEN 325 MG PO TABS: 650.0000 mg | ORAL_TABLET | Freq: Four times a day (QID) | ORAL | Status: DC | PRN

## 2024-02-20 MED ORDER — FENTANYL CITRATE (PF) 100 MCG/2ML IJ SOLN
INTRAMUSCULAR | Status: AC
  Filled 2024-02-20: qty 2

## 2024-02-20 MED ORDER — PANTOPRAZOLE SODIUM 40 MG PO TBEC
40.0000 mg | DELAYED_RELEASE_TABLET | Freq: Every day | ORAL | Status: DC
  Administered 2024-02-20 – 2024-02-21 (×2): 40 mg via ORAL
  Filled 2024-02-20 (×2): qty 1

## 2024-02-20 MED ORDER — MORPHINE SULFATE (PF) 4 MG/ML IV SOLN: 4.0000 mg | INTRAVENOUS | Status: DC | PRN

## 2024-02-20 MED ORDER — HYDROCODONE-ACETAMINOPHEN 5-325 MG PO TABS
1.0000 | ORAL_TABLET | ORAL | Status: DC | PRN
  Administered 2024-02-20: 1 via ORAL
  Administered 2024-02-20 – 2024-02-21 (×2): 2 via ORAL
  Filled 2024-02-20 (×4): qty 2

## 2024-02-20 MED ORDER — LIDOCAINE HCL (CARDIAC) PF 100 MG/5ML IV SOSY
PREFILLED_SYRINGE | INTRAVENOUS | Status: DC | PRN
  Administered 2024-02-20: 60 mg via INTRAVENOUS

## 2024-02-20 MED ORDER — ONDANSETRON HCL 4 MG/2ML IJ SOLN
INTRAMUSCULAR | Status: DC | PRN
  Administered 2024-02-20: 4 mg via INTRAVENOUS

## 2024-02-20 MED ORDER — OXYCODONE HCL 5 MG PO TABS: 5.0000 mg | ORAL_TABLET | Freq: Once | ORAL | Status: DC | PRN

## 2024-02-20 MED ORDER — ROCURONIUM BROMIDE 10 MG/ML (PF) SYRINGE
PREFILLED_SYRINGE | INTRAVENOUS | Status: AC
Start: 2024-02-20 — End: 2024-02-20
  Filled 2024-02-20: qty 10

## 2024-02-20 MED ORDER — 0.9 % SODIUM CHLORIDE (POUR BTL) OPTIME
TOPICAL | Status: DC | PRN
  Administered 2024-02-20: 1000 mL

## 2024-02-20 MED ORDER — INDOCYANINE GREEN 25 MG IJ SOLR
1.2500 mg | Freq: Once | INTRAMUSCULAR | Status: AC
  Administered 2024-02-20: 1.25 mg via INTRAVENOUS
  Filled 2024-02-20: qty 10

## 2024-02-20 MED ORDER — LACTATED RINGERS IV SOLN
INTRAVENOUS | Status: DC | PRN
Start: 2024-02-20 — End: 2024-02-20

## 2024-02-20 MED ORDER — ONDANSETRON HCL 4 MG/2ML IJ SOLN: 4.0000 mg | Freq: Four times a day (QID) | INTRAMUSCULAR | Status: DC | PRN

## 2024-02-20 MED ORDER — FENTANYL CITRATE (PF) 100 MCG/2ML IJ SOLN: 25.0000 ug | INTRAMUSCULAR | Status: DC | PRN

## 2024-02-20 MED ORDER — PIPERACILLIN-TAZOBACTAM 3.375 G IVPB 30 MIN: 3.3750 g | Freq: Once | INTRAVENOUS | Status: DC

## 2024-02-20 MED ORDER — ONDANSETRON HCL 4 MG/2ML IJ SOLN
4.0000 mg | Freq: Once | INTRAMUSCULAR | Status: AC
  Administered 2024-02-20: 4 mg via INTRAVENOUS
  Filled 2024-02-20: qty 2

## 2024-02-20 MED ORDER — ENOXAPARIN SODIUM 40 MG/0.4ML IJ SOSY
40.0000 mg | PREFILLED_SYRINGE | INTRAMUSCULAR | Status: DC
  Administered 2024-02-21: 40 mg via SUBCUTANEOUS
  Filled 2024-02-20: qty 0.4

## 2024-02-20 MED ORDER — LACTATED RINGERS IV BOLUS
1000.0000 mL | Freq: Once | INTRAVENOUS | Status: AC
  Administered 2024-02-20: 1000 mL via INTRAVENOUS

## 2024-02-20 MED ORDER — PHENYLEPHRINE 80 MCG/ML (10ML) SYRINGE FOR IV PUSH (FOR BLOOD PRESSURE SUPPORT)
PREFILLED_SYRINGE | INTRAVENOUS | Status: AC
  Filled 2024-02-20: qty 10

## 2024-02-20 MED ORDER — LIDOCAINE HCL (PF) 2 % IJ SOLN
INTRAMUSCULAR | Status: AC
  Filled 2024-02-20: qty 5

## 2024-02-20 MED ORDER — OXYCODONE HCL 5 MG/5ML PO SOLN: 5.0000 mg | Freq: Once | ORAL | Status: DC | PRN

## 2024-02-20 MED ORDER — CEFAZOLIN SODIUM-DEXTROSE 2-4 GM/100ML-% IV SOLN
2.0000 g | INTRAVENOUS | Status: AC
  Administered 2024-02-20: 2 g via INTRAVENOUS

## 2024-02-20 MED ORDER — CITALOPRAM HYDROBROMIDE 20 MG PO TABS
40.0000 mg | ORAL_TABLET | Freq: Every day | ORAL | Status: DC
  Administered 2024-02-20 – 2024-02-21 (×2): 40 mg via ORAL
  Filled 2024-02-20 (×2): qty 2

## 2024-02-20 MED ORDER — ACETAMINOPHEN 10 MG/ML IV SOLN
INTRAVENOUS | Status: AC
Start: 2024-02-20 — End: 2024-02-20
  Filled 2024-02-20: qty 100

## 2024-02-20 MED ORDER — ONDANSETRON HCL 4 MG/2ML IJ SOLN
INTRAMUSCULAR | Status: AC
Start: 2024-02-20 — End: 2024-02-20
  Filled 2024-02-20: qty 2

## 2024-02-20 MED ORDER — FENTANYL CITRATE (PF) 100 MCG/2ML IJ SOLN
INTRAMUSCULAR | Status: DC | PRN
  Administered 2024-02-20 (×3): 50 ug via INTRAVENOUS

## 2024-02-20 MED ORDER — PHENYLEPHRINE 80 MCG/ML (10ML) SYRINGE FOR IV PUSH (FOR BLOOD PRESSURE SUPPORT)
PREFILLED_SYRINGE | INTRAVENOUS | Status: DC | PRN
  Administered 2024-02-20 (×2): 80 ug via INTRAVENOUS

## 2024-02-20 MED ORDER — SODIUM CHLORIDE 0.9 % IV SOLN: INTRAVENOUS | Status: DC

## 2024-02-20 SURGICAL SUPPLY — 32 items
CANNULA REDUCER 12-8 DVNC XI (CANNULA) ×1 IMPLANT
CAUTERY HOOK MNPLR 1.6 DVNC XI (INSTRUMENTS) ×1 IMPLANT
CLIP LIGATING HEMO O LOK GREEN (MISCELLANEOUS) ×1 IMPLANT
DEFOGGER SCOPE WARM SEASHARP (MISCELLANEOUS) ×1 IMPLANT
DERMABOND ADVANCED .7 DNX12 (GAUZE/BANDAGES/DRESSINGS) ×1 IMPLANT
DRAPE ARM DVNC X/XI (DISPOSABLE) ×4 IMPLANT
DRAPE COLUMN DVNC XI (DISPOSABLE) ×1 IMPLANT
ELECTRODE REM PT RTRN 9FT ADLT (ELECTROSURGICAL) ×1 IMPLANT
FORCEPS BPLR FENES DVNC XI (FORCEP) ×1 IMPLANT
FORCEPS PROGRASP DVNC XI (FORCEP) ×1 IMPLANT
GLOVE BIO SURGEON STRL SZ 6.5 (GLOVE) ×2 IMPLANT
GLOVE BIOGEL PI IND STRL 6.5 (GLOVE) ×2 IMPLANT
GLOVE SURG SYN 6.5 PF PI (GLOVE) ×2 IMPLANT
GOWN STRL REUS W/ TWL LRG LVL3 (GOWN DISPOSABLE) ×4 IMPLANT
GRASPER SUT TROCAR 14GX15 (MISCELLANEOUS) ×1 IMPLANT
IRRIGATOR SUCT 8 DISP DVNC XI (IRRIGATION / IRRIGATOR) IMPLANT
IV 0.9% NACL 1000 ML (IV SOLUTION) IMPLANT
KIT PINK PAD W/HEAD ARM REST (MISCELLANEOUS) ×1 IMPLANT
LABEL OR SOLS (LABEL) ×1 IMPLANT
MANIFOLD NEPTUNE II (INSTRUMENTS) IMPLANT
NDL HYPO 22X1.5 SAFETY MO (MISCELLANEOUS) ×1 IMPLANT
NDL INSUFFLATION 14GA 120MM (NEEDLE) ×1 IMPLANT
NS IRRIG 500ML POUR BTL (IV SOLUTION) ×1 IMPLANT
OBTURATOR OPTICALSTD 8 DVNC (TROCAR) ×1 IMPLANT
PACK LAP CHOLECYSTECTOMY (MISCELLANEOUS) ×1 IMPLANT
SEAL UNIV 5-12 XI (MISCELLANEOUS) ×4 IMPLANT
SET TUBE SMOKE EVAC HIGH FLOW (TUBING) ×1 IMPLANT
SOLN STERILE WATER 500 ML (IV SOLUTION) ×1 IMPLANT
SOLUTION ELECTROSURG ANTI STCK (MISCELLANEOUS) ×1 IMPLANT
SUT VICRYL 0 UR6 27IN ABS (SUTURE) ×1 IMPLANT
SUTURE MNCRL 4-0 27XMF (SUTURE) ×1 IMPLANT
SYSTEM BAG RETRIEVAL 10MM (BASKET) ×1 IMPLANT

## 2024-02-20 NOTE — Op Note (Signed)
 Preoperative diagnosis: Acute cholecystitis  Postoperative diagnosis: Acute cholecystitis  Procedure: Robotic Assisted Laparoscopic Cholecystectomy.   Anesthesia: GETA   Surgeon: Dr. Cesar Coe  Wound Classification: Clean Contaminated  Indications: Patient is a 78 y.o. female developed right upper quadrant pain, nausea, vomiting, leukocytosis and on workup was found to have cholelithiasis with dilated gallbladder and cholecystitis. Robotic Assisted Laparoscopic cholecystectomy was elected.  Findings: Tense, dilated gallbladder Short cystic duct.  Adequate hemostasis  Description of procedure: The patient was placed on the operating table in the supine position. General anesthesia was induced. A time-out was completed verifying correct patient, procedure, site, positioning, and implant(s) and/or special equipment prior to beginning this procedure. An orogastric tube was placed. The abdomen was prepped and draped in the usual sterile fashion.  An incision was made in a natural skin line below the umbilicus.  The fascia was elevated and the Veress needle inserted. Proper position was confirmed by aspiration and saline meniscus test.  The abdomen was insufflated with carbon dioxide to a pressure of 15 mmHg. The patient tolerated insufflation well. A 8-mm trocar was then inserted in optiview fashion.  The laparoscope was inserted and the abdomen inspected. No injuries from initial trocar placement were noted. Additional trocars were then inserted in the following locations: an 8-mm trocar in the left lateral abdomen, and another two 8-mm trocars to the right side of the abdomen 5 cm appart. The umbilical trocar was changed to a 12 mm trocar all under direct visualization. The abdomen was inspected and no abnormalities were found. The table was placed in the reverse Trendelenburg position with the right side up. The robotic arms were docked and target anatomy identified. Instrument inserted under  direct visualization.  Very hard, tense and distended gallbladder was identified.  He was unable to be grasped.  I needed to make an incision clean on the top of the dome of the gallbladder and suction the clear bile.  With this maneuver, the dome of the gallbladder was grasped with a prograsp and retracted over the dome of the liver. The infundibulum was also grasped with an atraumatic grasper and retracted toward the right lower quadrant. This maneuver exposed Calot's triangle.  Severe acute over chronic inflammation of the triangle of Calot was identified and unable to completely critical view of safety.  A very difficult and time-consuming dome down dissection was needed to be done.  I needed to open the gallbladder at the gallbladder neck to be able to identify the opening of the cystic duct.  With this maneuver I was able to dissect the cystic duct circumferentially.  Firefly images taken to visualize biliary ducts. The cystic duct was then doubly clipped.  Hemostasis was checked and the gallbladder and contained stones were removed using an endoscopic retrieval bag. The gallbladder was passed off the table as a specimen. The gallbladder fossa was copiously irrigated with saline and hemostasis was obtained. There was no evidence of bleeding from the gallbladder fossa or cystic artery or leakage of the bile from the cystic duct stump. Secondary trocars were removed under direct vision. No bleeding was noted. The robotic arms were undoked. The scope was withdrawn and the umbilical trocar removed. The abdomen was allowed to collapse. The fascia of the 12mm trocar sites was closed with figure-of-eight 0 vicryl sutures. The skin was closed with subcuticular sutures of 4-0 monocryl and topical skin adhesive. The orogastric tube was removed.  The patient tolerated the procedure well and was taken to the postanesthesia care  unit in stable condition.   Specimen: Gallbladder  Complications: None  EBL: 10 mL

## 2024-02-20 NOTE — Plan of Care (Signed)
  Problem: Education: Goal: Knowledge of General Education information will improve Description: Including pain rating scale, medication(s)/side effects and non-pharmacologic comfort measures 02/20/2024 2127 by Cathyann Ceola HERO, RN Outcome: Progressing 02/20/2024 2103 by Cathyann Ceola HERO, RN Outcome: Progressing   Problem: Health Behavior/Discharge Planning: Goal: Ability to manage health-related needs will improve Outcome: Progressing   Problem: Clinical Measurements: Goal: Respiratory complications will improve Outcome: Progressing   Problem: Clinical Measurements: Goal: Cardiovascular complication will be avoided Outcome: Progressing   Problem: Activity: Goal: Risk for activity intolerance will decrease Outcome: Progressing   Problem: Elimination: Goal: Will not experience complications related to urinary retention Outcome: Progressing   Problem: Pain Managment: Goal: General experience of comfort will improve and/or be controlled Outcome: Progressing

## 2024-02-20 NOTE — H&P (Signed)
 SURGICAL HISTORY AND PHYSICAL NOTE   HISTORY OF PRESENT ILLNESS (HPI):  78 y.o. female presented to [facility] ED for evaluation of dizziness. Patient reports she was having epigastric and right upper quadrant pain last night.  Pain was so severe that she developed some distended fell.  She was also nauseated and having some vomiting.  She endorses that the pain radiates to her back.  She cannot identify any alleviating or aggravating factors.  She has been having intermittent pain since a week ago.  At the ED she was found with tenderness to palpation in the right upper quadrant.  Stable vital signs at the moment of my evaluation.  Labs shows mild leukocytosis of 12.  Normal AST/ALT and bilirubin level.  She had an ultrasound of the abdomen and pelvis that shows dilated gallbladder with biliary sludge and cholelithiasis.  I personally evaluated the images.  Surgery is consulted by Dr. Claudene in this context for evaluation and management of acute cholecystitis.  PAST MEDICAL HISTORY (PMH):  Past Medical History:  Diagnosis Date   Allergic rhinitis    Arthritis    Basal cell carcinoma 05/10/2019   forehead    Cancer (HCC)    skin ca-basal cell   Depression    Endometrial polyp    Family history of adverse reaction to anesthesia    mother-- ponv   GERD (gastroesophageal reflux disease)    Pre-diabetes    Sinus congestion    Wears contact lenses    not currently (03/16/21)     PAST SURGICAL HISTORY (PSH):  Past Surgical History:  Procedure Laterality Date   APPENDECTOMY  1967   BLEPHAROPLASTY Bilateral 04/2016   upper eyelid   CHEILECTOMY Left 03/30/2021   Procedure: CHEILECTOMY;  Surgeon: Lennie Barter, DPM;  Location: [facility];  Service: Podiatry;  Laterality: Left;   COLONOSCOPY  2014   COMBINED HYSTEROSCOPY DIAGNOSTIC / D&C  08-24-2004   dr norval [facility]   DILATATION & CURETTAGE/HYSTEROSCOPY WITH MYOSURE N/A 06/03/2017   Procedure: DILATATION & CURETTAGE/HYSTEROSCOPY WITH  MYOSURE RESECTION OF ENDOMETRIAL POLYP;  Surgeon: Rockney Evalene SQUIBB, MD;  Location: [facility];  Service: Gynecology;  Laterality: N/A;  request to follow 7:30am case inf [facility] block time requests one hour   EXCISION MORTON'S NEUROMA Right 2016   FOOT SURGERY Bilateral 03/1998   removal bone spurs   KNEE ARTHROSCOPY Right 05/05/2020   Procedure: ARTHROSCOPY KNEE;  Surgeon: Kathlynn Sharper, MD;  Location: [facility];  Service: Orthopedics;  Laterality: Right;   MOHS SURGERY     ROTATOR CUFF REPAIR Right 2010   TOTAL HIP ARTHROPLASTY Right 08/06/2022   Procedure: TOTAL HIP ARTHROPLASTY;  Surgeon: Mardee Lynwood SQUIBB, MD;  Location: [facility];  Service: Orthopedics;  Laterality: Right;   TUBAL LIGATION  yrs ago     MEDICATIONS:  Prior to Admission medications   Medication Sig Start Date End Date Taking? Authorizing Provider  amoxicillin -clavulanate (AUGMENTIN ) 875-125 MG tablet Take 1 tablet by mouth every 12 (twelve) hours. 06/26/23   Teresa Shelba SAUNDERS, NP  cetirizine (ZYRTEC) 10 MG tablet Take 10 mg by mouth every morning.    [provider]  citalopram  (CELEXA ) 40 MG tablet Take 40 mg by mouth every morning.    [provider]  clindamycin (CLEOCIN T) 1 % lotion Apply 1 Application topically as needed.    [provider]  Cyanocobalamin (B-12 PO) Take 2,000 mcg by mouth daily.    [provider]  Echinacea 400 MG  CAPS Take 1 capsule by mouth daily.    [provider]  estradiol -levonorgestrel  (CLIMARA  PRO) 0.045-0.015 MG/DAY Place 1 patch onto the skin once a week. 02/26/23   Prentiss Riggs A, NP  fluorometholone  (FML) 0.1 % ophthalmic suspension 1 drop every 4 (four) hours.    [provider]  guaiFENesin -codeine  100-10 MG/5ML syrup Take 5 mLs by mouth every 6 (six) hours as needed for cough. 06/21/23   Teresa Shelba SAUNDERS, NP  hydrocortisone  2.5 % lotion Apply 1 Application topically as directed. APPLY TO AFFECTED AREAS  EVERY OTHER DAY AS NEEDED 04/03/23   Hester Alm BROCKS, MD  ipratropium (ATROVENT ) 0.03 % nasal spray Place 2 sprays into both nostrils every 12 (twelve) hours. 06/21/23   Teresa Shelba SAUNDERS, NP  meloxicam (MOBIC) 15 MG tablet Take by mouth. 01/02/23   [provider]  mometasone  (ELOCON ) 0.1 % lotion Apply topically as directed. Apply to aa's ears QD PRN flares. Avoid face, groin, and underarms. 04/03/23   Hester Alm BROCKS, MD  neomycin -polymyxin b-dexamethasone  (MAXITROL) 3.5-10000-0.1 SUSP Place 1 drop into the right eye as needed.    [provider]  omeprazole (PRILOSEC) 20 MG capsule Take 20 mg by mouth every morning. 06/29/21   [provider]  OVER THE COUNTER MEDICATION Take 1 tablet by mouth daily at 6 (six) AM. Theracumin    [provider]  predniSONE  (STERAPRED UNI-PAK 21 TAB) 10 MG (21) TBPK tablet Take by mouth daily. Take 6 tabs by mouth daily  for 1 days, then 5 tabs for 1 days, then 4 tabs for 1 days, then 3 tabs for 1 days, 2 tabs for 1 days, then 1 tab by mouth daily for 1 days 06/21/23   Teresa Shelba SAUNDERS, NP  traZODone  (DESYREL ) 150 MG tablet Take 150 mg by mouth at bedtime.    [provider]  VITAMIN D PO Take 1,000 Units by mouth daily.    [provider]     ALLERGIES:  No Known Allergies   SOCIAL HISTORY:  Social History   Socioeconomic History   Marital status: Married    Spouse name: Not on file   Number of children: Not on file   Years of education: Not on file   Highest education level: Not on file  Occupational History   Not on file  Tobacco Use   Smoking status: Never   Smokeless tobacco: Never  Vaping Use   Vaping status: Never Used  Substance and Sexual Activity   Alcohol use: Yes    Alcohol/week: 7.0 standard drinks of alcohol    Types: 7 Glasses of wine per week    Comment: 4 times weekly wine   Drug use: No   Sexual activity: Yes    Birth control/protection: Post-menopausal    Comment: 1st  intercourse 78 yo--Fewer than 5 partners  Other Topics Concern   Not on file  Social History Narrative   Not on file   Social Drivers of Health   Financial Resource Strain: Low Risk  (01/06/2024)   Received from [facility]   Overall Financial Resource Strain (CARDIA)    Difficulty of Paying Living Expenses: Not hard at all  Food Insecurity: No Food Insecurity (01/06/2024)   Received from [facility]   Hunger Vital Sign    Within the past 12 months, you worried that your food would run out before you got the money to buy more.: Never true    Within the past  12 months, the food you bought just didn't last and you didn't have money to get more.: Never true  Transportation Needs: No Transportation Needs (01/06/2024)   Received from [facility] - Transportation    In the past 12 months, has lack of transportation kept you from medical appointments or from getting medications?: No    Lack of Transportation (Non-Medical): No  Physical Activity: Not on file  Stress: Not on file  Social Connections: Not on file  Intimate Partner Violence: Unknown (08/06/2022)   Humiliation, Afraid, Rape, and Kick questionnaire    Fear of Current or Ex-Partner: No    Emotionally Abused: No    Physically Abused: No    Sexually Abused: Not on file     FAMILY HISTORY:  Family History  Problem Relation Age of Onset   Hypertension Mother    Diabetes Mother    Heart disease Mother    Heart disease Father    Breast cancer Maternal Aunt        Age 29's     REVIEW OF SYSTEMS:  Constitutional: denies weight loss, fever, chills, or sweats  Eyes: denies any other vision changes, history of eye injury  ENT: denies sore throat, hearing problems  Respiratory: denies shortness of breath, wheezing  Cardiovascular: denies chest pain, palpitations  Gastrointestinal: positive abdominal pain, nausea and vomitnig Genitourinary: denies burning with  urination or urinary frequency Musculoskeletal: denies any other joint pains or cramps  Skin: denies any other rashes or skin discolorations  Neurological: denies any other headache, dizziness, weakness  Psychiatric: denies any other depression, anxiety   All other review of systems were negative   VITAL SIGNS:  Temp:  [98.2 F (36.8 C)] 98.2 F (36.8 C) (11/27 0908) Pulse Rate:  [77-82] 79 (11/27 1025) Resp:  [14-25] 16 (11/27 1025) BP: (125-148)/(55-63) 125/55 (11/27 1000) SpO2:  [92 %-100 %] 98 % (11/27 1025) Weight:  [56.7 kg] 56.7 kg (11/27 0908)     Height: 5' 2 (157.5 cm) Weight: 56.7 kg BMI (Calculated): 22.86   INTAKE/OUTPUT:  This shift: No intake/output data recorded.  Last 2 shifts: @IOLAST2SHIFTS @   PHYSICAL EXAM:  Constitutional:  -- Normal body habitus  -- Awake, alert, and oriented x3  Eyes:  -- Pupils equally round and reactive to light  -- No scleral icterus  Ear, nose, and throat:  -- No jugular venous distension  Pulmonary:  -- No crackles  -- Equal breath sounds bilaterally -- Breathing non-labored at rest Cardiovascular:  -- S1, S2 present  -- No pericardial rubs Gastrointestinal:  -- Abdomen soft, tender on palpation in the right upper quadrant, non-distended, no guarding or rebound tenderness -- No abdominal masses appreciated, pulsatile or otherwise  Musculoskeletal and Integumentary:  -- Wounds: None appreciated -- Extremities: B/L UE and LE FROM, hands and feet warm, no edema  Neurologic:  -- Motor function: intact and symmetric -- Sensation: intact and symmetric   Labs:     Latest Ref Rng & Units 02/20/2024    9:15 AM 02/13/2024    2:37 AM 07/30/2022   11:01 AM  CBC  WBC 4.0 - 10.5 K/uL 12.2  5.9  5.6   Hemoglobin 12.0 - 15.0 g/dL 87.1  87.6  86.9   Hematocrit 36.0 - 46.0 % 37.1  36.5  38.3   Platelets 150 - 400 K/uL 240  252  272       Latest Ref Rng & Units 02/20/2024    9:15 AM 02/13/2024  2:37 AM 07/30/2022   11:01 AM   CMP  Glucose 70 - 99 mg/dL 834  879  882   BUN 8 - 23 mg/dL 8  11  16    Creatinine 0.44 - 1.00 mg/dL 9.32  9.39  9.34   Sodium 135 - 145 mmol/L 136  137  137   Potassium 3.5 - 5.1 mmol/L 3.6  3.9  3.9   Chloride 98 - 111 mmol/L 105  104  104   CO2 22 - 32 mmol/L 21  23  25    Calcium 8.9 - 10.3 mg/dL 8.5  8.9  9.2   Total Protein 6.5 - 8.1 g/dL 6.2   6.7   Total Bilirubin 0.0 - 1.2 mg/dL 0.8   0.6   Alkaline Phos 38 - 126 U/L 54   56   AST 15 - 41 U/L 21   19   ALT 0 - 44 U/L 12   17      Imaging studies:  EXAM: ULTRASOUND ABDOMEN LIMITED RIGHT UPPER QUADRANT   COMPARISON:  None Available.   FINDINGS: Gallbladder:   Several tiny gallstone seen as well as gallbladder sludge. Gallbladder is distended but shows no No evidence of wall thickening or pericholecystic fluid. No sonographic Murphy sign noted by sonographer.   Common bile duct:   Diameter: 4 mm, within normal limits.   Liver:   No focal lesion identified. Within normal limits in parenchymal echogenicity. Portal vein is patent on color Doppler imaging with normal direction of blood flow towards the liver.   Other: None.   IMPRESSION: Distended gallbladder with cholelithiasis and layering sludge. No other sonographic signs of acute cholecystitis or biliary ductal dilatation.     Electronically Signed   By: Norleen DELENA Kil M.D.   On: 02/20/2024 09:55  Assessment/Plan:  78 y.o. female with acute cholecystitis, complicated by pertinent comorbidities including GERD.  Patient with history, physical exam and images consistent with acute cholecystitis. Patient oriented about diagnosis and surgical management as treatment.   Discussed the risk of surgery including post-op infxn, seroma, biloma, chronic pain, poor-delayed wound healing, retained gallstone, conversion to open procedure, post-op SBO or ileus, and need for additional procedures to address said risks.  The risks of general anesthetic including MI, CVA,  sudden death or even reaction to anesthetic medications also discussed. Alternatives include continued observation.  Benefits include possible symptom relief, prevention of complications including acute cholecystitis, pancreatitis.  Lucas Petrin, MD

## 2024-02-20 NOTE — Anesthesia Procedure Notes (Signed)
 Procedure Name: Intubation Date/Time: 02/20/2024 11:19 AM  Performed by: Jaylene Nest, CRNAPre-anesthesia Checklist: Patient identified, Patient being monitored, Timeout performed, Emergency Drugs available and Suction available Patient Re-evaluated:Patient Re-evaluated prior to induction Oxygen Delivery Method: Circle system utilized Preoxygenation: Pre-oxygenation with 100% oxygen Induction Type: IV induction Ventilation: Mask ventilation without difficulty Laryngoscope Size: 3 and McGrath Grade View: Grade II Tube type: Oral Tube size: 7.0 mm Number of attempts: 1 Airway Equipment and Method: Stylet and Video-laryngoscopy Placement Confirmation: ETT inserted through vocal cords under direct vision, positive ETCO2 and breath sounds checked- equal and bilateral Secured at: 21 cm Tube secured with: Tape Dental Injury: Teeth and Oropharynx as per pre-operative assessment  Difficulty Due To: Difficulty was anticipated and Difficult Airway- due to anterior larynx

## 2024-02-20 NOTE — ED Provider Notes (Signed)
 [facility] Provider Note    Event Date/Time   First MD Initiated Contact with Patient 02/20/24 [phone]     (approximate)   History   Fall   HPI  Kimberly Leon is a 78 y.o. female who presents to the ED for evaluation of Fall   Review of routine PCP visit from last month.  Prescribed Mounjaro  Patient presents with postprandial RUQ pain, nausea and vomiting over the past few days.  Had a minor fall today and orthostasis without syncope   Physical Exam   Triage Vital Signs: ED Triage Vitals [02/20/24 0908]  Encounter Vitals Group     BP      Girls Systolic BP Percentile      Girls Diastolic BP Percentile      Boys Systolic BP Percentile      Boys Diastolic BP Percentile      Pulse      Resp      Temp 98.2 F (36.8 C)     Temp Source Oral     SpO2      Weight 125 lb (56.7 kg)     Height 5' 2 (1.575 m)     Head Circumference      Peak Flow      Pain Score 0     Pain Loc      Pain Education      Exclude from Growth Chart     Most recent vital signs: Vitals:   02/20/24 1024 02/20/24 1025  BP:    Pulse: 80 79  Resp: 15 16  Temp:    SpO2: 98% 98%    General: Awake, no distress.  CV:  Good peripheral perfusion.  Resp:  Normal effort.  Abd:  No distention.  Localized RUQ tenderness is present MSK:  No deformity noted.  Neuro:  No focal deficits appreciated. Other:     ED Results / Procedures / Treatments   Labs (all labs ordered are listed, but only abnormal results are displayed) Labs Reviewed  CBC WITH DIFFERENTIAL/PLATELET - Abnormal; Notable for the following components:      Result Value   WBC 12.2 (*)    Neutro Abs 10.0 (*)    Monocytes Absolute 1.2 (*)    Abs Immature Granulocytes 0.10 (*)    All other components within normal limits  COMPREHENSIVE METABOLIC PANEL WITH GFR - Abnormal; Notable for the following components:   CO2 21 (*)    Glucose, Bld 165 (*)    Calcium 8.5 (*)    Total Protein 6.2 (*)    All  other components within normal limits  LIPASE, BLOOD  MAGNESIUM   TROPONIN T, HIGH SENSITIVITY    EKG Sinus rhythm with a rate of 78 bpm.  Left bundle morphology.  No STEMI by Sgarbossa criteria.  Similar morphology as previous EKG  RADIOLOGY RUQ ultrasound interpreted by me with distended gallbladder with stones and sludge  Official radiology report(s): US  ABDOMEN LIMITED RUQ (LIVER/GB) Result Date: 02/20/2024 CLINICAL DATA:  Right upper quadrant pain. EXAM: ULTRASOUND ABDOMEN LIMITED RIGHT UPPER QUADRANT COMPARISON:  None Available. FINDINGS: Gallbladder: Several tiny gallstone seen as well as gallbladder sludge. Gallbladder is distended but shows no No evidence of wall thickening or pericholecystic fluid. No sonographic Murphy sign noted by sonographer. Common bile duct: Diameter: 4 mm, within normal limits. Liver: No focal lesion identified. Within normal limits in parenchymal echogenicity. Portal vein is patent on color Doppler imaging with normal direction of blood flow  towards the liver. Other: None. IMPRESSION: Distended gallbladder with cholelithiasis and layering sludge. No other sonographic signs of acute cholecystitis or biliary ductal dilatation. Electronically Signed   By: Norleen DELENA Kil M.D.   On: 02/20/2024 09:55    PROCEDURES and INTERVENTIONS:  .1-3 Lead EKG Interpretation  Performed by: Claudene Rover, MD Authorized by: Claudene Rover, MD     Interpretation: normal     ECG rate:  80   ECG rate assessment: normal     Rhythm: sinus rhythm     Ectopy: none     Conduction: normal     Medications  citalopram  (CELEXA ) tablet 40 mg (has no administration in time range)  traZODone  (DESYREL ) tablet 150 mg (has no administration in time range)  pantoprazole  (PROTONIX ) EC tablet 40 mg (has no administration in time range)  enoxaparin  (LOVENOX ) injection 40 mg (has no administration in time range)  ceFAZolin  (ANCEF ) IVPB 2g/100 mL premix (has no administration in time range)   0.9 %  sodium chloride  infusion (has no administration in time range)  acetaminophen  (TYLENOL ) tablet 650 mg (has no administration in time range)    Or  acetaminophen  (TYLENOL ) suppository 650 mg (has no administration in time range)  HYDROcodone -acetaminophen  (NORCO/VICODIN) 5-325 MG per tablet 1-2 tablet (has no administration in time range)  morphine  (PF) 4 MG/ML injection 4 mg (has no administration in time range)  ondansetron  (ZOFRAN -ODT) disintegrating tablet 4 mg (has no administration in time range)    Or  ondansetron  (ZOFRAN ) injection 4 mg (has no administration in time range)  indocyanine green  (IC-GREEN ) injection 1.25 mg (has no administration in time range)  lactated ringers  bolus 1,000 mL (1,000 mLs Intravenous New Bag/Given 02/20/24 0945)  ondansetron  (ZOFRAN ) injection 4 mg (4 mg Intravenous Given 02/20/24 0944)  morphine  (PF) 4 MG/ML injection 4 mg (4 mg Intravenous Given 02/20/24 0945)     IMPRESSION / MDM / ASSESSMENT AND PLAN / ED COURSE  I reviewed the triage vital signs and the nursing notes.  Differential diagnosis includes, but is not limited to, ACS, biliary colic, dehydration, AKI, sepsis, ICH, seizure, stroke  {Patient presents with symptoms of an acute illness or injury that is potentially life-threatening.  Patient presents after a minor fall today superimposed on a couple days of postprandial upper abdominal pain, nausea and vomiting was concerning for acute cholecystitis and suitable for surgical admission.  Patient looks generally well, has localized RUQ tenderness but no significant signs of trauma or neurologic deficits.  Mild leukocytosis is noted, normal LFTs and lipase, normal troponin and reassuring EKG. RUQ ultrasound with distended gallbladder, stones and sludge.  Consult surgery who agrees to admit for full cystectomy.  Provide Zosyn   Clinical Course as of 02/20/24 1051  Thu Feb 20, 2024  1011 I consult with Dr. Cesar, he will see [DS]     Clinical Course User Index [DS] Claudene Rover, MD     FINAL CLINICAL IMPRESSION(S) / ED DIAGNOSES   Final diagnoses:  Acute cholecystitis     Rx / DC Orders   ED Discharge Orders     None        Note:  This document was prepared using Dragon voice recognition software and may include unintentional dictation errors.   Claudene Rover, MD 02/20/24 1051

## 2024-02-20 NOTE — Transfer of Care (Signed)
 Immediate Anesthesia Transfer of Care Note  Patient: Kimberly Leon  Procedure(s) Performed: CHOLECYSTECTOMY, ROBOT-ASSISTED, LAPAROSCOPIC  Patient Location: PACU  Anesthesia Type:General  Level of Consciousness: awake, alert , and oriented  Airway & Oxygen Therapy: Patient Spontanous Breathing and Patient connected to face mask oxygen  Post-op Assessment: Report given to RN and Post -op Vital signs reviewed and stable  Post vital signs: Reviewed and stable  Last Vitals:  Vitals Value Taken Time  BP 117/67 02/20/24 12:45  Temp    Pulse 87 02/20/24 12:48  Resp 21 02/20/24 12:48  SpO2 99 % 02/20/24 12:48  Vitals shown include unfiled device data.  Last Pain:  Vitals:   02/20/24 1034  TempSrc:   PainSc: 0-No pain         Complications: No notable events documented.

## 2024-02-20 NOTE — ED Triage Notes (Addendum)
 Pt to ED via [facility] from home for fall. Pt was standing and began to feel dizzy and weak and fell back. Pt did hit her head. No LOC. No blood thinners. Pt's initial BP 55/33 and repeat after 500mL NS 129/51. EMS reports pt has been having N/V since yesterday with 2 episodes of vomiting and flank pain on L side since Saturday.

## 2024-02-20 NOTE — Plan of Care (Signed)
  Problem: Education: Goal: Knowledge of General Education information will improve Description: Including pain rating scale, medication(s)/side effects and non-pharmacologic comfort measures Outcome: Progressing   Problem: Clinical Measurements: Goal: Ability to maintain clinical measurements within normal limits will improve Outcome: Progressing Goal: Will remain free from infection Outcome: Progressing Goal: Diagnostic test results will improve Outcome: Progressing Goal: Respiratory complications will improve Outcome: Progressing Goal: Cardiovascular complication will be avoided Outcome: Progressing   Problem: Activity: Goal: Risk for activity intolerance will decrease Outcome: Progressing   Problem: Pain Managment: Goal: General experience of comfort will improve and/or be controlled Outcome: Progressing   Problem: Safety: Goal: Ability to remain free from injury will improve Outcome: Progressing   Problem: Skin Integrity: Goal: Risk for impaired skin integrity will decrease Outcome: Progressing

## 2024-02-20 NOTE — Anesthesia Preprocedure Evaluation (Signed)
 Anesthesia Evaluation  Patient identified by MRN, date of birth, ID band Patient awake    Reviewed: Allergy & Precautions, NPO status , Patient's Chart, lab work & pertinent test results  Airway Mallampati: II  TM Distance: >3 FB Neck ROM: full    Dental  (+) Dental Advidsory Given   Pulmonary neg pulmonary ROS   Pulmonary exam normal        Cardiovascular negative cardio ROS Normal cardiovascular exam     Neuro/Psych  PSYCHIATRIC DISORDERS  Depression    negative neurological ROS     GI/Hepatic Neg liver ROS,GERD  Medicated and Controlled,,  Endo/Other  negative endocrine ROS    Renal/GU      Musculoskeletal   Abdominal   Peds  Hematology negative hematology ROS (+)   Anesthesia Other Findings Past Medical History: No date: Allergic rhinitis No date: Arthritis 05/10/2019: Basal cell carcinoma     Comment:  forehead  No date: Cancer (HCC)     Comment:  skin ca-basal cell No date: Depression No date: Endometrial polyp No date: Family history of adverse reaction to anesthesia     Comment:  mother-- ponv No date: GERD (gastroesophageal reflux disease) No date: Pre-diabetes No date: Sinus congestion No date: Wears contact lenses     Comment:  not currently (03/16/21)  Past Surgical History: 1967: APPENDECTOMY 04/2016: BLEPHAROPLASTY; Bilateral     Comment:  upper eyelid 03/30/2021: CHEILECTOMY; Left     Comment:  Procedure: CHEILECTOMY;  Surgeon: Lennie Barter, DPM;                Location: [facility];  Service: Podiatry;                Laterality: Left; 2014: COLONOSCOPY 08-24-2004   dr norval [facility]: COMBINED HYSTEROSCOPY DIAGNOSTIC / D&C 06/03/2017: DILATATION & CURETTAGE/HYSTEROSCOPY WITH MYOSURE; N/A     Comment:  Procedure: DILATATION & CURETTAGE/HYSTEROSCOPY WITH               MYOSURE RESECTION OF ENDOMETRIAL POLYP;  Surgeon:               Rockney Evalene SQUIBB, MD;  Location: [facility];  Service: Gynecology;  Laterality: N/A;  request               to follow 7:30am case inf [place] [facility] block               time requests one hour 2016: EXCISION MORTON'S NEUROMA; Right 03/1998: FOOT SURGERY; Bilateral     Comment:  removal bone spurs 05/05/2020: KNEE ARTHROSCOPY; Right     Comment:  Procedure: ARTHROSCOPY KNEE;  Surgeon: Kathlynn Sharper,               MD;  Location: [facility];  Service: Orthopedics;                Laterality: Right; No date: MOHS SURGERY 2010: ROTATOR CUFF REPAIR; Right yrs ago: TUBAL LIGATION  BMI    Body Mass Index: 25.97 kg/m      Reproductive/Obstetrics negative OB ROS                              Anesthesia Physical Anesthesia Plan  ASA: 2  Anesthesia Plan: General ETT   Post-op Pain Management:    Induction: Intravenous  PONV Risk Score and Plan: 3  and Ondansetron , Dexamethasone  and Midazolam   Airway Management Planned: Oral ETT  Additional Equipment:   Intra-op Plan:   Post-operative Plan: Extubation in OR  Informed Consent: I have reviewed the patients History and Physical, chart, labs and discussed the procedure including the risks, benefits and alternatives for the proposed anesthesia with the patient or authorized representative who has indicated his/her understanding and acceptance.     Dental Advisory Given  Plan Discussed with: Anesthesiologist, CRNA and Surgeon  Anesthesia Plan Comments: (Patient consented for risks of anesthesia including but not limited to:  - adverse reactions to medications - damage to eyes, teeth, lips or other oral mucosa - nerve damage due to positioning  - sore throat or hoarseness - Damage to heart, brain, nerves, lungs, other parts of body or loss of life  Patient voiced understanding and assent.)        Anesthesia Quick Evaluation

## 2024-02-21 ENCOUNTER — Other Ambulatory Visit: Payer: Self-pay

## 2024-02-21 MED ORDER — TRAMADOL HCL 50 MG PO TABS
50.0000 mg | ORAL_TABLET | Freq: Four times a day (QID) | ORAL | 0 refills | Status: AC | PRN
Start: 2024-02-21 — End: 2025-02-20
  Filled 2024-02-21: qty 10, 3d supply, fill #0

## 2024-02-21 NOTE — Discharge Summary (Signed)
 Patient ID: Kimberly Leon MRN: [id number] DOB/AGE: 05-13-1945 78 y.o.  Admit date: 02/20/2024 Discharge date: 02/21/2024   Discharge Diagnoses:  Principal Problem:   Acute cholecystitis   Procedures: Robotic assisted laparoscopic cholecystectomy  Hospital Course: Patient presented to the ER with upper abdominal pain.  Workup shows leukocytosis and distended bladder with cholelithiasis.  She was admitted for acute cholecystitis and surgical management.  She underwent robotic assisted upper scopic cholecystectomy and tolerated well.  This morning she is still with pain control.  She is tolerating diet.  The incisions are healing well.  No sign of complications.  Physical Exam Vitals reviewed.  Cardiovascular:     Rate and Rhythm: Normal rate and regular rhythm.     Pulses: Normal pulses.  Pulmonary:     Effort: Pulmonary effort is normal.  Abdominal:     General: Abdomen is flat. There is no distension.     Palpations: Abdomen is soft.  Musculoskeletal:        General: Normal range of motion.     Cervical back: Normal range of motion.  Skin:    General: Skin is warm.     Capillary Refill: Capillary refill takes less than 2 seconds.  Neurological:     Mental Status: She is alert and oriented to person, place, and time.      Consults: None  Disposition: Discharge disposition: 01-Home or Self Care       Discharge Instructions     Diet - low sodium heart healthy   Complete by: As directed    Increase activity slowly   Complete by: As directed       Allergies as of 02/21/2024   No Known Allergies      Medication List     TAKE these medications    amoxicillin -clavulanate 875-125 MG tablet Commonly known as: AUGMENTIN  Take 1 tablet by mouth every 12 (twelve) hours.   B-12 PO Take 2,000 mcg by mouth daily.   cetirizine 10 MG tablet Commonly known as: ZYRTEC Take 10 mg by mouth every morning.   citalopram  40 MG tablet Commonly known as:  CELEXA  Take 40 mg by mouth every morning.   Climara  Pro 0.045-0.015 MG/DAY Generic drug: estradiol -levonorgestrel  Place 1 patch onto the skin once a week.   clindamycin 1 % lotion Commonly known as: CLEOCIN T Apply 1 Application topically as needed.   Echinacea 400 MG Caps Take 1 capsule by mouth daily.   fluorometholone  0.1 % ophthalmic suspension Commonly known as: FML 1 drop every 4 (four) hours.   guaiFENesin -codeine  100-10 MG/5ML syrup Take 5 mLs by mouth every 6 (six) hours as needed for cough.   hydrocortisone  2.5 % lotion Apply 1 Application topically as directed. APPLY TO AFFECTED AREAS EVERY OTHER DAY AS NEEDED   ipratropium 0.03 % nasal spray Commonly known as: ATROVENT  Place 2 sprays into both nostrils every 12 (twelve) hours.   meloxicam 15 MG tablet Commonly known as: MOBIC Take by mouth.   mometasone  0.1 % lotion Commonly known as: ELOCON  Apply topically as directed. Apply to aa's ears QD PRN flares. Avoid face, groin, and underarms.   Mounjaro 5 MG/0.5ML Pen Generic drug: tirzepatide Inject 5 mg into the skin once a week.   neomycin -polymyxin b-dexamethasone  3.5-10000-0.1 Susp Commonly known as: MAXITROL Place 1 drop into the right eye as needed.   omeprazole 20 MG capsule Commonly known as: PRILOSEC Take 20 mg by mouth every morning.   OVER THE COUNTER MEDICATION Take 1 tablet by mouth  daily at 6 (six) AM. Theracumin   predniSONE  10 MG (21) Tbpk tablet Commonly known as: STERAPRED UNI-PAK 21 TAB Take by mouth daily. Take 6 tabs by mouth daily  for 1 days, then 5 tabs for 1 days, then 4 tabs for 1 days, then 3 tabs for 1 days, 2 tabs for 1 days, then 1 tab by mouth daily for 1 days   traMADol  50 MG tablet Commonly known as: Ultram  Take 1 tablet (50 mg total) by mouth every 6 (six) hours as needed.   traZODone  150 MG tablet Commonly known as: DESYREL  Take 150 mg by mouth at bedtime.   VITAMIN D PO Take 1,000 Units by mouth daily.         Follow-up Information     Rodolph Romano, MD Follow up in 2 week(s).   Specialty: General Surgery Why: Follow up after cholecystectomy Contact information: [address] [place] [address] [phone]                 This discharge encounter was for 35-minute most of the time counseling the patient and coordinating plan of care.

## 2024-02-21 NOTE — Discharge Instructions (Signed)
  Diet: Resume home heart healthy low fat diet.   Activity: No heavy lifting >20 pounds (children, pets, laundry, garbage) or strenuous activity until follow-up, but light activity and walking are encouraged. Do not drive or drink alcohol if taking narcotic pain medications.  Wound care: May shower with soapy water and pat dry (do not rub incisions), but no baths or submerging incision underwater until follow-up. (no swimming)   Medications: Resume all home medications. For mild to moderate pain: acetaminophen  (Tylenol ) or ibuprofen (if no kidney disease). Combining Tylenol  with alcohol can substantially increase your risk of causing liver disease. Narcotic pain medications, if prescribed, can be used for severe pain, though may cause nausea, constipation, and drowsiness. If you do not need the narcotic pain medication, you do not need to fill the prescription.  Call office [phone]) at any time if any questions, worsening pain, fevers/chills, bleeding, drainage from incision site, or other concerns.

## 2024-02-21 NOTE — TOC CM/SW Note (Signed)
  Transition of Care [facility]) Screening Note   Patient Details  Name: AYNSLEE MULHALL Date of Birth: [birth year]   Transition of Care [facility]) CM/SW Contact:    Corean ONEIDA Haddock, RN Phone Number: 02/21/2024, 9:30 AM    Transition of Care Department [facility]) has reviewed patient and no TOC needs have been identified at this time. . If new patient transition needs arise, please place a TOC consult.

## 2024-02-24 LAB — SURGICAL PATHOLOGY

## 2024-02-25 NOTE — Anesthesia Postprocedure Evaluation (Signed)
 Anesthesia Post Note  Patient: ZOLA RUNION  Procedure(s) Performed: CHOLECYSTECTOMY, ROBOT-ASSISTED, LAPAROSCOPIC  Patient location during evaluation: PACU Anesthesia Type: General Level of consciousness: awake and alert Pain management: pain level controlled Vital Signs Assessment: post-procedure vital signs reviewed and stable Respiratory status: spontaneous breathing, nonlabored ventilation, respiratory function stable and patient connected to nasal cannula oxygen Cardiovascular status: blood pressure returned to baseline and stable Postop Assessment: no apparent nausea or vomiting Anesthetic complications: no   No notable events documented.   Last Vitals:  Vitals:   02/21/24 0415 02/21/24 0839  BP: (!) 104/54 (!) 121/49  Pulse: 71 67  Resp: 16 17  Temp: 36.7 C (!) 36.3 C  SpO2: 93% 96%    Last Pain:  Vitals:   02/21/24 0820  TempSrc:   PainSc: 5                  Debby Mines

## 2024-02-28 ENCOUNTER — Ambulatory Visit: Admitting: Cardiovascular Disease

## 2024-03-06 ENCOUNTER — Encounter: Payer: Self-pay | Admitting: Cardiovascular Disease

## 2024-03-06 ENCOUNTER — Ambulatory Visit: Admitting: Cardiovascular Disease

## 2024-03-06 VITALS — BP 100/60 | HR 80 | Ht 61.0 in | Wt 120.0 lb

## 2024-03-06 DIAGNOSIS — R079 Chest pain, unspecified: Secondary | ICD-10-CM | POA: Diagnosis not present

## 2024-03-06 DIAGNOSIS — K810 Acute cholecystitis: Secondary | ICD-10-CM

## 2024-03-06 DIAGNOSIS — I2510 Atherosclerotic heart disease of native coronary artery without angina pectoris: Secondary | ICD-10-CM | POA: Diagnosis not present

## 2024-03-06 NOTE — Patient Instructions (Signed)

## 2024-03-06 NOTE — Progress Notes (Signed)
 Cardiology Office Note  Date:  03/06/2024   ID:  Kimberly Leon, Kimberly Leon 09/06/1945, MRN [id number]  PCP:  Lenon Layman ORN, MD   Chief Complaint  Patient presents with   New Patient (Initial Visit)    Self referred for chest pain. Patient was at [facility] ER with chest pain on 02/13/2024; she was found to have a acute cholecystitis and had gallbladder surgery.  Patient has indigestion more frequently lately.      HPI:  Kimberly Leon is a 78 y.o. female with past medical history of: Past Medical History:  Diagnosis Date   Allergic rhinitis    Arthritis    Basal cell carcinoma 05/10/2019   forehead    Cancer (HCC)    skin ca-basal cell   Depression    Endometrial polyp    Family history of adverse reaction to anesthesia    mother-- ponv   GERD (gastroesophageal reflux disease)    Pre-diabetes    Sinus congestion    Wears contact lenses    not currently (03/16/21)  Who presents by referral from Dr. Josette Ward for chest pain  On November 20 reports waking up 2 AM with chest pain on the left of unclear etiology As symptoms persisted, she went to the emergency room In the ER February 13, 2024 She reported pain left side of her chest without radiation  Took full dose aspirin at home and given 1 nitroglycerin  under her tongue with EMS and pain improved  Workup negative, EKG nonacute enzymes negative  In the ER February 20, 2024 for fall cholecystitis, abdominal pain Underwent robotic assisted cholecystectomy  CT scan chest images reviewed showing minimal aortic atherosclerosis, mild LAD coronary calcification  A1C 5.6 Total chol 185, LDL 103  EKG personally reviewed by myself on todays visit EKG Interpretation Date/Time:  Friday March 06 2024 10:12:27 EST Ventricular Rate:  80 PR Interval:  164 QRS Duration:  134 QT Interval:  442 QTC Calculation: 509 R Axis:   -62  Text Interpretation: Sinus rhythm with Premature ventricular complexes Left axis deviation Left  ventricular hypertrophy with QRS widening and repolarization abnormality ( R in aVL , Cornell product ) When compared with ECG of 20-Feb-2024 09:06, PREVIOUS ECG IS PRESENT Confirmed by Perla Lye [phone]) on 03/06/2024 10:18:25 AM     PMH:   has a past medical history of Allergic rhinitis, Arthritis, Basal cell carcinoma (05/10/2019), Cancer (HCC), Depression, Endometrial polyp, Family history of adverse reaction to anesthesia, GERD (gastroesophageal reflux disease), Pre-diabetes, Sinus congestion, and Wears contact lenses.   PSH:    Past Surgical History:  Procedure Laterality Date   APPENDECTOMY  1967   BLEPHAROPLASTY Bilateral 04/2016   upper eyelid   CHEILECTOMY Left 03/30/2021   Procedure: CHEILECTOMY;  Surgeon: Lennie Barter, DPM;  Location: [facility];  Service: Podiatry;  Laterality: Left;   COLONOSCOPY  2014   COMBINED HYSTEROSCOPY DIAGNOSTIC / D&C  08-24-2004   dr norval [facility]   DILATATION & CURETTAGE/HYSTEROSCOPY WITH MYOSURE N/A 06/03/2017   Procedure: DILATATION & CURETTAGE/HYSTEROSCOPY WITH MYOSURE RESECTION OF ENDOMETRIAL POLYP;  Surgeon: Rockney Lye SQUIBB, MD;  Location: [facility];  Service: Gynecology;  Laterality: N/A;  request to follow 7:30am case inf [place] [facility] block time requests one hour   EXCISION MORTON'S NEUROMA Right 2016   FOOT SURGERY Bilateral 03/1998   removal bone spurs   KNEE ARTHROSCOPY Right 05/05/2020   Procedure: ARTHROSCOPY KNEE;  Surgeon: Kathlynn Sharper, MD;  Location: [facility];  Service:  Orthopedics;  Laterality: Right;   MOHS SURGERY     ROTATOR CUFF REPAIR Right 2010   TOTAL HIP ARTHROPLASTY Right 08/06/2022   Procedure: TOTAL HIP ARTHROPLASTY;  Surgeon: Mardee Lynwood SQUIBB, MD;  Location: [facility];  Service: Orthopedics;  Laterality: Right;   TUBAL LIGATION  yrs ago    Current Outpatient Medications  Medication Sig Dispense Refill   cetirizine (ZYRTEC) 10 MG tablet Take 10 mg by mouth every morning.      citalopram  (CELEXA ) 40 MG tablet Take 40 mg by mouth every morning.     clindamycin (CLEOCIN T) 1 % lotion Apply 1 Application topically as needed.     Cyanocobalamin (B-12 PO) Take 2,000 mcg by mouth daily.     estradiol -levonorgestrel  (CLIMARA  PRO) 0.045-0.015 MG/DAY Place 1 patch onto the skin once a week. 12 patch 4   fluorometholone  (FML) 0.1 % ophthalmic suspension 1 drop every 4 (four) hours.     hydrocortisone  2.5 % lotion Apply 1 Application topically as directed. APPLY TO AFFECTED AREAS EVERY OTHER DAY AS NEEDED 59 mL 0   meloxicam (MOBIC) 15 MG tablet Take by mouth.     mometasone  (ELOCON ) 0.1 % lotion Apply topically as directed. Apply to aa's ears QD PRN flares. Avoid face, groin, and underarms. 60 mL 0   neomycin -polymyxin b-dexamethasone  (MAXITROL) 3.5-10000-0.1 SUSP Place 1 drop into the right eye as needed.     omeprazole (PRILOSEC) 20 MG capsule Take 20 mg by mouth every morning.     OVER THE COUNTER MEDICATION Take 1 tablet by mouth daily at 6 (six) AM. Theracumin     traZODone  (DESYREL ) 150 MG tablet Take 150 mg by mouth at bedtime.     VITAMIN D PO Take 1,000 Units by mouth daily.     tirzepatide (MOUNJARO) 5 MG/0.5ML Pen Inject 5 mg into the skin once a week. (Patient not taking: Reported on 03/06/2024)     traMADol  (ULTRAM ) 50 MG tablet Take 1 tablet (50 mg total) by mouth every 6 (six) hours as needed. (Patient not taking: Reported on 03/06/2024) 10 tablet 0   No current facility-administered medications for this visit.    Allergies:   Patient has no known allergies.   Social History:  The patient  reports that she has never smoked. She has never used smokeless tobacco. She reports current alcohol use of about 7.0 standard drinks of alcohol per week. She reports that she does not use drugs.   Family History:   family history includes Breast cancer in her maternal aunt; Diabetes in her mother; Heart attack (age of onset: 17) in her father; Heart disease in her father;  Heart disease (age of onset: 6) in her mother; Hypertension in her mother; Stroke in her mother.    Review of Systems: Review of Systems  Constitutional: Negative.   HENT: Negative.    Respiratory: Negative.    Cardiovascular: Negative.   Gastrointestinal: Negative.   Musculoskeletal: Negative.   Neurological: Negative.   Psychiatric/Behavioral: Negative.    All other systems reviewed and are negative.   PHYSICAL EXAM: VS:  BP 100/60 (BP Location: Right Arm, Patient Position: Sitting, Cuff Size: Normal)   Pulse 80   Ht 5' 1 (1.549 m)   Wt 120 lb (54.4 kg)   SpO2 96%   BMI 22.67 kg/m  , BMI Body mass index is 22.67 kg/m. GEN: Well nourished, well developed, in no acute distress HEENT: normal Neck: no JVD, carotid bruits, or masses Cardiac: RRR; no murmurs,  rubs, or gallops,no edema  Respiratory:  clear to auscultation bilaterally, normal work of breathing GI: soft, nontender, nondistended, + BS MS: no deformity or atrophy Skin: warm and dry, no rash Neuro:  Strength and sensation are intact Psych: euthymic mood, full affect   Recent Labs: 02/20/2024: ALT 12; BUN 8; Creatinine, Ser 0.67; Hemoglobin 12.8; Magnesium  2.0; Platelets 240; Potassium 3.6; Sodium 136    Lipid Panel No results found for: CHOL, HDL, LDLCALC, TRIG    Wt Readings from Last 3 Encounters:  03/06/24 120 lb (54.4 kg)  02/20/24 125 lb (56.7 kg)  02/13/24 125 lb (56.7 kg)     ASSESSMENT AND PLAN:  Problem List Items Addressed This Visit     Chest pain of uncertain etiology - Primary   Relevant Orders   EKG 12-Lead (Completed)   Acute cholecystitis   Atypical chest pain Symptoms November 20, workup in the ER negative Very mild coronary calcification on CT chest Few risk factors for coronary disease -Normal EKG Typically exercises on a regular basis without anginal symptoms -Recommend no further ischemic workup at this time For recurrent symptoms concerning for angina (pain on  exertion) would consider cardiac CTA  Coronary calcification No prior smoking history, A1c well-controlled -Cholesterol reasonable on no medications though did discuss the option for more aggressive cholesterol management as below - Will hold off on any further testing at this time  Hyperlipidemia Total cholesterol 185 LDL 103 -Given mild coronary calcifications, one could argue goal total cholesterol less than 150 and LDL less than 70 - After discussing, she will hold off on medications at this time given good lifestyle management, few risk factors, very mild disease       Signed, Velinda Lunger, M.D., Ph.D. [facility] [place], [place] [phone]

## 2024-04-05 ENCOUNTER — Ambulatory Visit
Admission: EM | Admit: 2024-04-05 | Discharge: 2024-04-05 | Disposition: A | Discharge status: Home / Self Care | Attending: Emergency Medicine | Admitting: Emergency Medicine

## 2024-04-05 DIAGNOSIS — J069 Acute upper respiratory infection, unspecified: Secondary | ICD-10-CM | POA: Diagnosis not present

## 2024-04-05 LAB — POCT INFLUENZA A/B
Influenza A, POC: NEGATIVE
Influenza B, POC: NEGATIVE

## 2024-04-05 MED ORDER — BENZONATATE 100 MG PO CAPS: 100.0000 mg | ORAL_CAPSULE | Freq: Three times a day (TID) | ORAL | 0 refills | Status: AC | PRN

## 2024-04-05 NOTE — ED Provider Notes (Signed)
 " CAY RALPH PELT    CSN: [id number] Arrival date & time: 04/05/24  1455      History   Chief Complaint Chief Complaint  Patient presents with   Cough    HPI MARDY HOPPE is a 79 y.o. female.  Patient presents with 2-day history of sneezing and cough.  No fever, ear pain, sore throat, shortness of breath, vomiting, diarrhea.  She has been treating her cough with Robitussin.  The history is provided by the patient and medical records.    Past Medical History:  Diagnosis Date   Allergic rhinitis    Arthritis    Basal cell carcinoma 05/10/2019   forehead    Cancer (HCC)    skin ca-basal cell   Depression    Endometrial polyp    Family history of adverse reaction to anesthesia    mother-- ponv   GERD (gastroesophageal reflux disease)    Pre-diabetes    Sinus congestion    Wears contact lenses    not currently (03/16/21)    Patient Active Problem List   Diagnosis Date Noted   Chest pain of uncertain etiology 03/06/2024   Acute cholecystitis 02/20/2024   Hx of total hip arthroplasty, right 08/06/2022   Primary osteoarthritis of right hip 09/01/2021   Osteoarthritis of carpometacarpal (CMC) joint of thumb 10/07/2017   Generalized osteoarthritis of hand 07/19/2014   Depression, major, in remission 07/19/2014   GERD without esophagitis 07/19/2014   Prediabetes 07/19/2014   HIP PAIN, RIGHT 01/17/2010   SCIATICA, RIGHT 01/17/2010    Past Surgical History:  Procedure Laterality Date   APPENDECTOMY  1967   BLEPHAROPLASTY Bilateral 04/2016   upper eyelid   CHEILECTOMY Left 03/30/2021   Procedure: CHEILECTOMY;  Surgeon: Lennie Barter, DPM;  Location: [facility];  Service: Podiatry;  Laterality: Left;   CHOLECYSTECTOMY     COLONOSCOPY  2014   COMBINED HYSTEROSCOPY DIAGNOSTIC / D&C  08-24-2004   dr norval [facility]   DILATATION & CURETTAGE/HYSTEROSCOPY WITH MYOSURE N/A 06/03/2017   Procedure: DILATATION & CURETTAGE/HYSTEROSCOPY WITH MYOSURE RESECTION OF  ENDOMETRIAL POLYP;  Surgeon: Rockney Evalene SQUIBB, MD;  Location:  S[facility]ERY [facility];  Service: Gynecology;  Laterality: N/A;  request to follow 7:30am case inf [facility] block time requests one hour   EXCISION MORTON'S NEUROMA Right 2016   FOOT SURGERY Bilateral 03/1998   removal bone spurs   KNEE ARTHROSCOPY Right 05/05/2020   Procedure: ARTHROSCOPY KNEE;  Surgeon: Kathlynn Sharper, MD;  Location: [facility];  Service: Orthopedics;  Laterality: Right;   MOHS SURGERY     ROTATOR CUFF REPAIR Right 2010   TOTAL HIP ARTHROPLASTY Right 08/06/2022   Procedure: TOTAL HIP ARTHROPLASTY;  Surgeon: Mardee Lynwood SQUIBB, MD;  Location: [facility];  Service: Orthopedics;  Laterality: Right;   TUBAL LIGATION  yrs ago    OB History     Gravida  2   Para  2   Term  2   Preterm      AB      Living  2      SAB      IAB      Ectopic      Multiple      Live Births  2            Home Medications    Prior to Admission medications  Medication Sig Start Date End Date Taking? Authorizing Provider  benzonatate  (TESSALON ) 100 MG capsule Take 1 capsule (100 mg total) by mouth  3 (three) times daily as needed for cough. 04/05/24  Yes Corlis Burnard DEL, NP  cetirizine (ZYRTEC) 10 MG tablet Take 10 mg by mouth every morning.    [provider]  citalopram  (CELEXA ) 40 MG tablet Take 40 mg by mouth every morning.    [provider]  clindamycin (CLEOCIN T) 1 % lotion Apply 1 Application topically as needed.    [provider]  Cyanocobalamin (B-12 PO) Take 2,000 mcg by mouth daily.    [provider]  estradiol -levonorgestrel  (CLIMARA  PRO) 0.045-0.015 MG/DAY Place 1 patch onto the skin once a week. 02/26/23   Prentiss Riggs A, NP  fluorometholone  (FML) 0.1 % ophthalmic suspension 1 drop every 4 (four) hours.    [provider]  hydrocortisone  2.5 % lotion Apply 1 Application topically as directed. APPLY TO AFFECTED AREAS EVERY OTHER DAY AS NEEDED  04/03/23   Hester Alm BROCKS, MD  meloxicam (MOBIC) 15 MG tablet Take by mouth. 01/02/23   [provider]  mometasone  (ELOCON ) 0.1 % lotion Apply topically as directed. Apply to aa's ears QD PRN flares. Avoid face, groin, and underarms. 04/03/23   Hester Alm BROCKS, MD  neomycin -polymyxin b-dexamethasone  (MAXITROL) 3.5-10000-0.1 SUSP Place 1 drop into the right eye as needed.    [provider]  omeprazole (PRILOSEC) 20 MG capsule Take 20 mg by mouth every morning. 06/29/21   [provider]  OVER THE COUNTER MEDICATION Take 1 tablet by mouth daily at 6 (six) AM. Theracumin    [provider]  tirzepatide (MOUNJARO) 5 MG/0.5ML Pen Inject 5 mg into the skin once a week. Patient not taking: Reported on 03/06/2024 01/09/24   [provider]  traMADol  (ULTRAM ) 50 MG tablet Take 1 tablet (50 mg total) by mouth every 6 (six) hours as needed. Patient not taking: Reported on 03/06/2024 02/21/24 02/20/25  Rodolph Romano, MD  traZODone  (DESYREL ) 150 MG tablet Take 150 mg by mouth at bedtime.    [provider]  VITAMIN D PO Take 1,000 Units by mouth daily.    [provider]    Family History Family History  Problem Relation Age of Onset   Hypertension Mother    Diabetes Mother    Heart disease Mother 46       CABG   Stroke Mother    Heart attack Father 22   Heart disease Father    Breast cancer Maternal Aunt        Age 55's    Social History Social History[1]   Allergies   Patient has no known allergies.   Review of Systems Review of Systems  Constitutional:  Negative for chills and fever.  HENT:  Positive for sneezing. Negative for ear pain and sore throat.   Respiratory:  Positive for cough. Negative for shortness of breath.   Gastrointestinal:  Negative for diarrhea and vomiting.     Physical Exam Triage Vital Signs ED Triage Vitals  Encounter Vitals Group     BP 04/05/24 1516 (!) 148/76     Girls Systolic BP  Percentile --      Girls Diastolic BP Percentile --      Boys Systolic BP Percentile --      Boys Diastolic BP Percentile --      Pulse Rate 04/05/24 1516 79     Resp 04/05/24 1516 17     Temp 04/05/24 1516 97.9 F (36.6 C)     Temp src --      SpO2 04/05/24  1516 98 %     Weight --      Height --      Head Circumference --      Peak Flow --      Pain Score 04/05/24 1515 3     Pain Loc --      Pain Education --      Exclude from Growth Chart --    No data found.  Updated Vital Signs BP (!) 148/76   Pulse 79   Temp 97.8 F (36.6 C) (Temporal)   Resp 17   SpO2 98%   Visual Acuity Right Eye Distance:   Left Eye Distance:   Bilateral Distance:    Right Eye Near:   Left Eye Near:    Bilateral Near:     Physical Exam Constitutional:      General: She is not in acute distress. HENT:     Right Ear: Tympanic membrane normal.     Left Ear: Tympanic membrane normal.     Nose: Nose normal.     Mouth/Throat:     Mouth: Mucous membranes are moist.     Pharynx: Oropharynx is clear.  Cardiovascular:     Rate and Rhythm: Normal rate and regular rhythm.     Heart sounds: Normal heart sounds.  Pulmonary:     Effort: Pulmonary effort is normal. No respiratory distress.     Breath sounds: Normal breath sounds.  Neurological:     Mental Status: She is alert.      UC Treatments / Results  Labs (all labs ordered are listed, but only abnormal results are displayed) Labs Reviewed  POCT INFLUENZA A/B - Normal    EKG   Radiology No results found.  Procedures Procedures (including critical care time)  Medications Ordered in UC Medications - No data to display  Initial Impression / Assessment and Plan / UC Course  I have reviewed the triage vital signs and the nursing notes.  Pertinent labs & imaging results that were available during my care of the patient were reviewed by me and considered in my medical decision making (see chart for details).    Viral URI.   Afebrile and vital signs are stable.  Lungs are clear and O2 sat is 98% on room air.  Rapid flu negative.  Patient declines COVID test.  Treating cough with Tessalon  Per[place].  Education provided on viral respiratory infection.  Instructed patient to follow-up with her PCP.  ED precautions given.  She agrees to plan of care.  Final Clinical Impressions(s) / UC Diagnoses   Final diagnoses:  Viral URI     Discharge Instructions      Your flu test is negative.    Take the Tessalon  pearls as directed for cough.    Follow up with your primary care provider.  Go to the emergency department if you have worsening symptoms.        ED Prescriptions     Medication Sig Dispense Auth. Provider   benzonatate  (TESSALON ) 100 MG capsule Take 1 capsule (100 mg total) by mouth 3 (three) times daily as needed for cough. 21 capsule Corlis Burnard DEL, NP      PDMP not reviewed this encounter.    [1]  Social History Tobacco Use   Smoking status: Never   Smokeless tobacco: Never  Vaping Use   Vaping status: Never Used  Substance Use Topics   Alcohol use: Yes    Alcohol/week: 7.0 standard drinks of alcohol  Types: 7 Glasses of wine per week    Comment: 4 times weekly wine   Drug use: No     Corlis Burnard DEL, NP 04/05/24 1557  "

## 2024-04-05 NOTE — Discharge Instructions (Addendum)
 Your flu test is negative.    Take the Tessalon  pearls as directed for cough.    Follow up with your primary care provider.  Go to the emergency department if you have worsening symptoms.

## 2024-04-05 NOTE — ED Triage Notes (Signed)
 Patient to Urgent Care with complaints of dry, hacking cough/ low energy. No known fevers.   Symptoms started Friday.   Taking robitussin.

## 2025-01-12 ENCOUNTER — Ambulatory Visit: Admitting: Dermatology
